# Patient Record
Sex: Female | Born: 1965 | Hispanic: No | Marital: Single | Smoking: Current every day smoker
Health system: Southern US, Community
[De-identification: ages and names within clinical notes are randomized; demographics above are authoritative.]

## PROBLEM LIST (undated history)

## (undated) DIAGNOSIS — K5792 Diverticulitis of intestine, part unspecified, without perforation or abscess without bleeding: Secondary | ICD-10-CM

## (undated) DIAGNOSIS — R519 Headache, unspecified: Secondary | ICD-10-CM

## (undated) DIAGNOSIS — R51 Headache: Secondary | ICD-10-CM

## (undated) DIAGNOSIS — T4145XA Adverse effect of unspecified anesthetic, initial encounter: Secondary | ICD-10-CM

## (undated) DIAGNOSIS — K219 Gastro-esophageal reflux disease without esophagitis: Secondary | ICD-10-CM

## (undated) DIAGNOSIS — Z87442 Personal history of urinary calculi: Secondary | ICD-10-CM

## (undated) DIAGNOSIS — I959 Hypotension, unspecified: Secondary | ICD-10-CM

## (undated) DIAGNOSIS — G459 Transient cerebral ischemic attack, unspecified: Secondary | ICD-10-CM

## (undated) DIAGNOSIS — T8859XA Other complications of anesthesia, initial encounter: Secondary | ICD-10-CM

## (undated) DIAGNOSIS — R569 Unspecified convulsions: Secondary | ICD-10-CM

## (undated) DIAGNOSIS — E236 Other disorders of pituitary gland: Secondary | ICD-10-CM

## (undated) HISTORY — PX: BLADDER SUSPENSION: SHX72

## (undated) HISTORY — PX: KIDNEY SURGERY: SHX687

## (undated) HISTORY — DX: Transient cerebral ischemic attack, unspecified: G45.9

## (undated) HISTORY — PX: PARTIAL HYSTERECTOMY: SHX80

## (undated) HISTORY — PX: ESOPHAGOGASTRODUODENOSCOPY: SHX1529

## (undated) HISTORY — DX: Unspecified convulsions: R56.9

## (undated) HISTORY — PX: DIAGNOSTIC LAPAROSCOPY: SUR761

## (undated) HISTORY — PX: COLONOSCOPY: SHX174

---

## 1995-01-04 HISTORY — PX: ABDOMINAL HYSTERECTOMY: SHX81

## 2003-05-13 ENCOUNTER — Other Ambulatory Visit: Payer: Self-pay

## 2004-01-04 DIAGNOSIS — G459 Transient cerebral ischemic attack, unspecified: Secondary | ICD-10-CM

## 2004-01-04 DIAGNOSIS — R569 Unspecified convulsions: Secondary | ICD-10-CM

## 2004-01-04 HISTORY — DX: Transient cerebral ischemic attack, unspecified: G45.9

## 2004-01-04 HISTORY — DX: Unspecified convulsions: R56.9

## 2004-01-20 ENCOUNTER — Ambulatory Visit: Payer: Self-pay | Admitting: Family Medicine

## 2004-11-04 ENCOUNTER — Ambulatory Visit: Payer: Self-pay | Admitting: Family Medicine

## 2005-10-21 ENCOUNTER — Ambulatory Visit: Payer: Self-pay | Admitting: Urology

## 2006-05-19 ENCOUNTER — Ambulatory Visit: Payer: Self-pay | Admitting: Gastroenterology

## 2006-05-23 ENCOUNTER — Ambulatory Visit: Payer: Self-pay | Admitting: Gastroenterology

## 2006-10-30 ENCOUNTER — Emergency Department: Payer: Self-pay

## 2006-10-30 ENCOUNTER — Other Ambulatory Visit: Payer: Self-pay

## 2007-06-06 ENCOUNTER — Ambulatory Visit: Payer: Self-pay | Admitting: Unknown Physician Specialty

## 2007-11-20 ENCOUNTER — Ambulatory Visit: Payer: Self-pay | Admitting: Unknown Physician Specialty

## 2009-08-27 ENCOUNTER — Emergency Department: Payer: Self-pay | Admitting: Emergency Medicine

## 2012-04-13 ENCOUNTER — Ambulatory Visit: Payer: Self-pay | Admitting: Physician Assistant

## 2013-07-24 ENCOUNTER — Ambulatory Visit: Payer: Self-pay | Admitting: Unknown Physician Specialty

## 2013-07-30 ENCOUNTER — Encounter: Payer: Self-pay | Admitting: *Deleted

## 2013-08-20 ENCOUNTER — Ambulatory Visit (INDEPENDENT_AMBULATORY_CARE_PROVIDER_SITE_OTHER): Payer: BC Managed Care – PPO | Admitting: General Surgery

## 2013-08-20 ENCOUNTER — Encounter: Payer: Self-pay | Admitting: General Surgery

## 2013-08-20 VITALS — BP 110/70 | HR 80 | Resp 12 | Ht 65.0 in | Wt 138.0 lb

## 2013-08-20 DIAGNOSIS — R2231 Localized swelling, mass and lump, right upper limb: Secondary | ICD-10-CM

## 2013-08-20 DIAGNOSIS — R229 Localized swelling, mass and lump, unspecified: Secondary | ICD-10-CM

## 2013-08-20 NOTE — Patient Instructions (Addendum)
Leave dressing in place for at least 2 days if possible leave steri strips in place Call with results

## 2013-08-20 NOTE — Progress Notes (Signed)
Patient ID: Linda Walker, female   DOB: 12/06/1965, 48 y.o.   MRN: [id number]  Chief Complaint  Patient presents with  . Breast Problem    right axillary mass    HPI Linda ElliotStephanie R Walker is a 48 y.o. female.  who presents for a breast evaluation. The most recent mammogram was done on 07-24-13. Previous mammogram was over 10 years ago.  Patient does perform regular self breast checks and does not get regular mammograms done.  States there is a knot at her right axilla. She noticed it 2 years ago. Over the last couple of months it has gotten larger and is painful and it does radiate to the arm and breast.  Denies family history of breast cancer.   HPI  Past Medical History  Diagnosis Date  . TIA (transient ischemic attack) 2006  . Seizure 2006    Past Surgical History  Procedure Laterality Date  . Bladder suspension  2005 ?  Marland Kitchen. Abdominal hysterectomy  1997    Family History  Problem Relation Age of Onset  . Hypertension Mother   . Cancer Father   . Diabetes Father   . Hypertension Father     Social History History  Substance Use Topics  . Smoking status: Current Every Day Smoker -- 0.25 packs/day    Types: Cigarettes  . Smokeless tobacco: Never Used  . Alcohol Use: No    No Known Allergies  No current outpatient prescriptions on file.   No current facility-administered medications for this visit.    Review of Systems Review of Systems  Constitutional: Negative.   Respiratory: Negative.   Cardiovascular: Negative.     Blood pressure 110/70, pulse 80, resp. rate 12, height 5\' 5"  (1.651 m), weight 138 lb (62.596 kg).  Physical Exam Physical Exam  Constitutional: She is oriented to person, place, and time. She appears well-developed and well-nourished.  Neck: Neck supple.  Cardiovascular: Normal rate, regular rhythm and normal heart sounds.   Pulmonary/Chest: Effort normal and breath sounds normal.  Lymphadenopathy:    She has no cervical adenopathy.   Neurological: She is alert and oriented to person, place, and time.  Skin: Skin is warm and dry.  1.5 cm soft tissue mass at the apex of the right axilla.    Data Reviewed Screening mammogram was obtained Cytotec she went suggested a density in the left breast. BI-RAD-0.  Focal spot compression views and ultrasound dated July 24, 2013 showed no persistent abnormality. BI-RAD-1.  Assessment    Noninflamed dermal cyst of the right axilla.     Plan    With the patient reported increasing size and vocal tenderness she desired to proceed with excision. 10 cc of 0.5% Xylocaine with 0.25% Marcaine with one 200,000 units of epinephrine was instilled after alcohol prep. This was supplemented with 2 cc 1% plain Xylocaine. The area was excised through a transverse incision with complete removal of the cyst lining. It was sent for routine histology. The skin defect was closed with a 4-0 Vicryl subcuticular suture. Benzoin and Steri-Strips followed by Telfa Tegaderm dressing was applied.  A prescription for Norco 5/325, #20 with the inscription 1 p.o. Q.4 h. P.r.n. For pain was provided. No refills.  The patient will return for nursing exam in one week.      [facility]: Dr Margretta DittyVanDalen  Nickolas Chalfin, Merrily PewJeffrey W 08/21/2013, 6:24 AM

## 2013-08-21 DIAGNOSIS — R2230 Localized swelling, mass and lump, unspecified upper limb: Secondary | ICD-10-CM | POA: Insufficient documentation

## 2013-08-22 LAB — PATHOLOGY

## 2013-08-27 ENCOUNTER — Ambulatory Visit (INDEPENDENT_AMBULATORY_CARE_PROVIDER_SITE_OTHER): Payer: Self-pay | Admitting: *Deleted

## 2013-08-27 DIAGNOSIS — R229 Localized swelling, mass and lump, unspecified: Secondary | ICD-10-CM

## 2013-08-27 DIAGNOSIS — R2231 Localized swelling, mass and lump, right upper limb: Secondary | ICD-10-CM

## 2013-08-27 NOTE — Progress Notes (Addendum)
Patient came in today for a wound check.  The wound is clean, with no signs of infection noted. Follow up as needed. Aware of pathology, pt pleased.

## 2013-08-27 NOTE — Patient Instructions (Addendum)
Follow up as needed

## 2013-11-04 ENCOUNTER — Encounter: Payer: Self-pay | Admitting: General Surgery

## 2014-05-20 ENCOUNTER — Emergency Department
Admission: EM | Admit: 2014-05-20 | Discharge: 2014-05-21 | Discharge status: Against Medical Advice | Payer: BC Managed Care – PPO | Attending: Obstetrics and Gynecology | Admitting: Obstetrics and Gynecology

## 2014-05-20 ENCOUNTER — Encounter: Payer: Self-pay | Admitting: Emergency Medicine

## 2014-05-20 DIAGNOSIS — R110 Nausea: Secondary | ICD-10-CM | POA: Insufficient documentation

## 2014-05-20 DIAGNOSIS — Z720 Tobacco use: Secondary | ICD-10-CM | POA: Insufficient documentation

## 2014-05-20 DIAGNOSIS — Z90710 Acquired absence of both cervix and uterus: Secondary | ICD-10-CM | POA: Diagnosis not present

## 2014-05-20 DIAGNOSIS — R1011 Right upper quadrant pain: Secondary | ICD-10-CM | POA: Insufficient documentation

## 2014-05-20 DIAGNOSIS — R630 Anorexia: Secondary | ICD-10-CM | POA: Diagnosis not present

## 2014-05-20 LAB — URINALYSIS COMPLETE WITH MICROSCOPIC (ARMC ONLY)
BACTERIA UA: NONE SEEN
BILIRUBIN URINE: NEGATIVE
GLUCOSE, UA: NEGATIVE mg/dL
HGB URINE DIPSTICK: NEGATIVE
KETONES UR: NEGATIVE mg/dL
Nitrite: NEGATIVE
PH: 7 (ref 5.0–8.0)
Protein, ur: NEGATIVE mg/dL
RBC / HPF: NONE SEEN RBC/hpf (ref 0–5)
Specific Gravity, Urine: 1.006 (ref 1.005–1.030)

## 2014-05-20 LAB — CBC WITH DIFFERENTIAL/PLATELET
BASOS ABS: 0 10*3/uL (ref 0–0.1)
BASOS PCT: 1 %
Eosinophils Absolute: 0.1 10*3/uL (ref 0–0.7)
Eosinophils Relative: 1 %
HCT: 41.3 % (ref 35.0–47.0)
Hemoglobin: 13.6 g/dL (ref 12.0–16.0)
Lymphocytes Relative: 46 %
Lymphs Abs: 3.8 10*3/uL — ABNORMAL HIGH (ref 1.0–3.6)
MCH: 27.7 pg (ref 26.0–34.0)
MCHC: 32.8 g/dL (ref 32.0–36.0)
MCV: 84.3 fL (ref 80.0–100.0)
Monocytes Absolute: 0.5 10*3/uL (ref 0.2–0.9)
Monocytes Relative: 6 %
NEUTROS ABS: 3.8 10*3/uL (ref 1.4–6.5)
Neutrophils Relative %: 46 %
PLATELETS: 188 10*3/uL (ref 150–440)
RBC: 4.9 MIL/uL (ref 3.80–5.20)
RDW: 13.5 % (ref 11.5–14.5)
WBC: 8.2 10*3/uL (ref 3.6–11.0)

## 2014-05-20 LAB — COMPREHENSIVE METABOLIC PANEL
ALBUMIN: 4 g/dL (ref 3.5–5.0)
ALT: 15 U/L (ref 14–54)
AST: 22 U/L (ref 15–41)
Alkaline Phosphatase: 55 U/L (ref 38–126)
Anion gap: 7 (ref 5–15)
BUN: 8 mg/dL (ref 6–20)
CALCIUM: 8.5 mg/dL — AB (ref 8.9–10.3)
CO2: 27 mmol/L (ref 22–32)
CREATININE: 0.65 mg/dL (ref 0.44–1.00)
Chloride: 108 mmol/L (ref 101–111)
GFR calc Af Amer: >60 mL/min (ref >60)
GFR calc non Af Amer: >60 mL/min (ref >60)
Glucose, Bld: 92 mg/dL (ref 65–99)
Potassium: 3.8 mmol/L (ref 3.5–5.1)
Sodium: 142 mmol/L (ref 135–145)
Total Bilirubin: 0.3 mg/dL (ref 0.3–1.2)
Total Protein: 6.3 g/dL — ABNORMAL LOW (ref 6.5–8.1)

## 2014-05-20 LAB — LIPASE, BLOOD: Lipase: 40 U/L (ref 22–51)

## 2014-05-20 NOTE — ED Notes (Addendum)
Pt presents to ED with right upper quad abd pain that intermittently radiates down to her groin for over a week that has progressively worsened. States it feels like "back labor on the right side". Pt reports nausea and decrease in appetite; denies vomiting. Hx of surgery on right kidney for "reflux" and states she knows when "something isn't right" with that kidney. Recently finished 10 days of Cipro for kidney infection.

## 2014-05-21 ENCOUNTER — Telehealth: Payer: Self-pay | Admitting: Emergency Medicine

## 2014-05-21 ENCOUNTER — Encounter: Payer: Self-pay | Admitting: Emergency Medicine

## 2014-05-21 ENCOUNTER — Emergency Department
Admission: EM | Admit: 2014-05-21 | Discharge: 2014-05-21 | Disposition: A | Discharge status: Home / Self Care | Payer: BC Managed Care – PPO | Attending: Emergency Medicine | Admitting: Emergency Medicine

## 2014-05-21 ENCOUNTER — Emergency Department: Payer: BC Managed Care – PPO

## 2014-05-21 DIAGNOSIS — Z720 Tobacco use: Secondary | ICD-10-CM | POA: Insufficient documentation

## 2014-05-21 DIAGNOSIS — R1011 Right upper quadrant pain: Secondary | ICD-10-CM | POA: Insufficient documentation

## 2014-05-21 DIAGNOSIS — R109 Unspecified abdominal pain: Secondary | ICD-10-CM

## 2014-05-21 MED ORDER — HYDROCODONE-ACETAMINOPHEN 5-325 MG PO TABS: 1.0000 | ORAL_TABLET | ORAL | Status: DC | PRN

## 2014-05-21 NOTE — ED Provider Notes (Signed)
[facility] Emergency Department Provider Note  ____________________________________________  Time seen: Approximately 4:33 PM  I have reviewed the triage vital signs and the nursing notes.   HISTORY  Chief Complaint Flank Pain    HPI Linda Walker is a 49 y.o. female with a history of kidney infections and surgery about 7 years ago on her right kidney for reflux presents with about 2 weeks of pain in the right flank and the right abdomen.She completed a 10 day course of Cipro yesterday and the pain had improved during the course, but the flank pain returned yesterday.  She had blood work and urine taken last night but she left without being seen due to the wait.  She returns today for evaluation  She describes the pain as waxing and waning but severe.  She has had nausea but no vomiting.  She denies dysuria.  She has had no fevers or chills.  No chest pain or shortness of breath.   Past Medical History  Diagnosis Date  . TIA (transient ischemic attack) 2006  . Seizure 2006    Patient Active Problem List   Diagnosis Date Noted  . Axillary mass 08/21/2013    Past Surgical History  Procedure Laterality Date  . Bladder suspension  2005 ?  Marland Kitchen Abdominal hysterectomy  1997  . Kidney surgery    . Partial hysterectomy      Current Outpatient Rx  Name  Route  Sig  Dispense  Refill  . HYDROcodone-acetaminophen (NORCO/VICODIN) 5-325 MG per tablet   Oral   Take 1-2 tablets by mouth every 4 (four) hours as needed for moderate pain.   15 tablet   0     Allergies Review of patient's allergies indicates no known allergies.  Family History  Problem Relation Age of Onset  . Hypertension Mother   . Cancer Father   . Diabetes Father   . Hypertension Father     Social History History  Substance Use Topics  . Smoking status: Current Every Day Smoker -- 0.25 packs/day    Types: Cigarettes  . Smokeless tobacco: Never Used  . Alcohol Use: No     Review of Systems Constitutional: No fever/chills Eyes: No visual changes. ENT: No sore throat. Cardiovascular: Denies chest pain. Respiratory: Denies shortness of breath. Gastrointestinal: Right-sided intermittent abdominal pain.  nausea, no vomiting.  No diarrhea.  No constipation. Genitourinary: Negative for dysuria. Musculoskeletal: Right flank pain Skin: Negative for rash. Neurological: Negative for headaches, focal weakness or numbness.  10-point ROS otherwise negative.  ____________________________________________   PHYSICAL EXAM:  VITAL SIGNS: ED Triage Vitals  Enc Vitals Group     BP 05/21/14 1409 109/65 mmHg     Pulse Rate 05/21/14 1409 66     Resp 05/21/14 1409 18     Temp 05/21/14 1409 98.1 F (36.7 C)     Temp Source 05/21/14 1409 Oral     SpO2 05/21/14 1409 100 %     Weight 05/21/14 1409 138 lb (62.596 kg)     Height 05/21/14 1409 5' 4"  (1.626 m)     Head Cir --      Peak Flow --      Pain Score 05/21/14 1411 8     Pain Loc --      Pain Edu? --      Excl. in [place]? --     Constitutional: Alert and oriented. Well appearing and in no acute distress. Eyes: Conjunctivae are normal. PERRL. EOMI. Head: Atraumatic.  Nose: No congestion/rhinnorhea. Mouth/Throat: Mucous membranes are moist.  Oropharynx non-erythematous. Neck: No stridor.   Cardiovascular: Normal rate, regular rhythm. Grossly normal heart sounds.  Good peripheral circulation. Respiratory: Normal respiratory effort.  No retractions. Lungs CTAB. Gastrointestinal: Soft soft with mild to moderate right upper quadrant tenderness. No distention. No abdominal bruits.  Mild to moderate right CVA tenderness. Musculoskeletal: No lower extremity tenderness nor edema.  No joint effusions. Neurologic:  Normal speech and language. No gross focal neurologic deficits are appreciated. Speech is normal. No gait instability. Skin:  Skin is warm, dry and intact. No rash noted. Psychiatric: Mood and affect are  normal. Speech and behavior are normal.  ____________________________________________   LABS (all labs ordered are listed, but only abnormal results are displayed)  Recent Results (from the past 2160 hour(s))  Comprehensive metabolic panel     Status: Abnormal   Collection Time: 05/20/14  8:42 PM  Result Value Ref Range   Sodium 142 135 - 145 mmol/L   Potassium 3.8 3.5 - 5.1 mmol/L   Chloride 108 101 - 111 mmol/L   CO2 27 22 - 32 mmol/L   Glucose, Bld 92 65 - 99 mg/dL   BUN 8 6 - 20 mg/dL   Creatinine, Ser 0.65 0.44 - 1.00 mg/dL   Calcium 8.5 (L) 8.9 - 10.3 mg/dL   Total Protein 6.3 (L) 6.5 - 8.1 g/dL   Albumin 4.0 3.5 - 5.0 g/dL   AST 22 15 - 41 U/L   ALT 15 14 - 54 U/L   Alkaline Phosphatase 55 38 - 126 U/L   Total Bilirubin 0.3 0.3 - 1.2 mg/dL   GFR calc non Af Amer >60 >60 mL/min   GFR calc Af Amer >60 >60 mL/min    Comment: (NOTE) The eGFR has been calculated using the CKD EPI equation. This calculation has not been validated in all clinical situations. eGFR's persistently <60 mL/min signify possible Chronic Kidney Disease.    Anion gap 7 5 - 15  Lipase, blood     Status: None   Collection Time: 05/20/14  8:42 PM  Result Value Ref Range   Lipase 40 22 - 51 U/L  CBC WITH DIFFERENTIAL     Status: Abnormal   Collection Time: 05/20/14  8:42 PM  Result Value Ref Range   WBC 8.2 3.6 - 11.0 K/uL   RBC 4.90 3.80 - 5.20 MIL/uL   Hemoglobin 13.6 12.0 - 16.0 g/dL   HCT 41.3 35.0 - 47.0 %   MCV 84.3 80.0 - 100.0 fL   MCH 27.7 26.0 - 34.0 pg   MCHC 32.8 32.0 - 36.0 g/dL   RDW 13.5 11.5 - 14.5 %   Platelets 188 150 - 440 K/uL   Neutrophils Relative % 46 %   Neutro Abs 3.8 1.4 - 6.5 K/uL   Lymphocytes Relative 46 %   Lymphs Abs 3.8 (H) 1.0 - 3.6 K/uL   Monocytes Relative 6 %   Monocytes Absolute 0.5 0.2 - 0.9 K/uL   Eosinophils Relative 1 %   Eosinophils Absolute 0.1 0 - 0.7 K/uL   Basophils Relative 1 %   Basophils Absolute 0.0 0 - 0.1 K/uL  Urinalysis complete,  with microscopic [facility])     Status: Abnormal   Collection Time: 05/20/14  8:42 PM  Result Value Ref Range   Color, Urine STRAW (A) YELLOW   APPearance CLEAR (A) CLEAR   Glucose, UA NEGATIVE NEGATIVE mg/dL   Bilirubin Urine NEGATIVE NEGATIVE   Ketones, ur  NEGATIVE NEGATIVE mg/dL   Specific Gravity, Urine 1.006 1.005 - 1.030   Hgb urine dipstick NEGATIVE NEGATIVE   pH 7.0 5.0 - 8.0   Protein, ur NEGATIVE NEGATIVE mg/dL   Nitrite NEGATIVE NEGATIVE   Leukocytes, UA TRACE (A) NEGATIVE   RBC / HPF NONE SEEN 0 - 5 RBC/hpf   WBC, UA 0-5 0 - 5 WBC/hpf   Bacteria, UA NONE SEEN NONE SEEN   Squamous Epithelial / LPF 0-5 (A) NONE SEEN     ____________________________________________  EKG  Not indicated ____________________________________________  RADIOLOGY  US Renal  05/21/2014   CLINICAL DATA:  Right flank pain  EXAM: RENAL / URINARY TRACT ULTRASOUND COMPLETE  COMPARISON:  04/13/2012  FINDINGS: Right Kidney:  Length: 11.4 cm. Echogenicity within normal limits. No mass or hydronephrosis visualized.  Left Kidney:  Length: 11.9 cm. Echogenicity within normal limits. No mass or hydronephrosis visualized.  Bladder:  Appears normal for degree of bladder distention.  IMPRESSION: No acute abnormality noted.   Electronically Signed   By: Inez Catalina M.D.   On: 05/21/2014 17:51   US Abdomen Limited Ruq  05/21/2014   CLINICAL DATA:  Right upper quadrant pain for 2 weeks.  EXAM: US ABDOMEN LIMITED - RIGHT UPPER QUADRANT  COMPARISON:  08/27/2009 abdominal pelvic CT.  FINDINGS: Gallbladder:  No gallstones or wall thickening visualized. No sonographic Murphy sign noted.  Common bile duct:  Diameter: Normal, 3 mm.  Liver:  No focal lesion identified. Within normal limits in parenchymal echogenicity.  IMPRESSION: Normal right upper quadrant ultrasound ; no explanation for pain.   Electronically Signed   By: Abigail Miyamoto M.D.   On: 05/21/2014 17:50     ____________________________________________   PROCEDURES  Procedure(s) performed: None  Critical Care performed: No  ____________________________________________   INITIAL IMPRESSION / ASSESSMENT AND PLAN / ED COURSE  Pertinent labs & imaging results that were available during my care of the patient were reviewed by me and considered in my medical decision making (see chart for details).  The patient has normal labs including a clean urinalysis and normal ultrasound of her kidneys and her right upper quadrant.  She has normal vital signs, is afebrile, and in no acute distress.  I do not believe she is at risk for a vascular issue (ischemic kidney).  I discussed my reassuring findings with the patient and advised her to follow up with Dr. Jacqlyn Larsen at the next available opportunity.  She understands and agrees with the plan.  I gave her my usual and customary return precautions. ____________________________________________   FINAL CLINICAL IMPRESSION(S) / ED DIAGNOSES  Final diagnoses:  RUQ abdominal pain  Right flank pain     Hinda Kehr, MD 05/21/14 1818

## 2014-05-21 NOTE — ED Notes (Signed)
Patient called for the third time. No response. Patient eloped from the status board at this time. ED registration and charge nurse made aware so that in the event that patient presents back to the desk requesting to be seen. 

## 2014-05-21 NOTE — ED Notes (Signed)
Patient called the second time for triage. No response. RN will reattempt to call patient one final time prior to eloping from the status board.   

## 2014-05-21 NOTE — ED Notes (Signed)
Patient called for triage by this RN. No answer. Will reattempt. 

## 2014-05-21 NOTE — Discharge Instructions (Signed)
You have been seen in the Emergency Department (ED) for flank and abdominal pain.  Your evaluation did not identify a clear cause of your symptoms but was generally reassuring.  Your vital signs, urinalysis, blood work, and ultrasounds of your kidneys, gallbladder, and liver were all normal.  Please follow up as instructed above regarding todays emergent visit and the symptoms that are bothering you.  Return to the ED if your abdominal pain worsens or fails to improve, you develop bloody vomiting, bloody diarrhea, you are unable to tolerate fluids due to vomiting, fever greater than 101, or other symptoms that concern you.  Take Vicodin as prescribed. Do not drink alcohol, drive or participate in any other potentially dangerous activities while taking this medication as it may make you sleepy. Do not take this medication with any other sedating medications, either prescription or over-the-counter. If you were prescribed Percocet or Vicodin, do not take these with acetaminophen (Tylenol) as it is already contained within these medications.   This medication is an opiate (or narcotic) pain medication and can be habit forming.  Use it as little as possible to achieve adequate pain control.  Do not use or use it with extreme caution if you have a history of opiate abuse or dependence.  If you are on a pain contract with your primary care doctor or a pain specialist, be sure to let them know you were prescribed this medication today from the [facility].  This medication is intended for your use only - do not give any to anyone else and keep it in a secure place where nobody else, especially children, have access to it.  It will also cause or worsen constipation, so you may want to consider taking an over-the-counter stool softener while you are taking this medication.

## 2014-05-21 NOTE — ED Notes (Signed)
Pt was here last night to be seen for abd pain. Pt had protocols completed but never saw a provider. Said she got tired of waiting and left. Returns today for abd pain.

## 2014-05-21 NOTE — ED Notes (Signed)
Pt reports that she has had a kidney infection she just finished Cipro Monday, yesterday she developed right flank pain. She was here last night and left. She had blood and urine taken and does not want it taken again.

## 2014-10-28 ENCOUNTER — Emergency Department
Admission: EM | Admit: 2014-10-28 | Discharge: 2014-10-28 | Disposition: A | Discharge status: Home / Self Care | Payer: BC Managed Care – PPO | Attending: Emergency Medicine | Admitting: Emergency Medicine

## 2014-10-28 ENCOUNTER — Emergency Department: Payer: BC Managed Care – PPO

## 2014-10-28 ENCOUNTER — Encounter: Payer: Self-pay | Admitting: Emergency Medicine

## 2014-10-28 DIAGNOSIS — J159 Unspecified bacterial pneumonia: Secondary | ICD-10-CM | POA: Diagnosis not present

## 2014-10-28 DIAGNOSIS — Z720 Tobacco use: Secondary | ICD-10-CM | POA: Insufficient documentation

## 2014-10-28 DIAGNOSIS — J189 Pneumonia, unspecified organism: Secondary | ICD-10-CM

## 2014-10-28 DIAGNOSIS — Z3202 Encounter for pregnancy test, result negative: Secondary | ICD-10-CM | POA: Diagnosis not present

## 2014-10-28 DIAGNOSIS — R1011 Right upper quadrant pain: Secondary | ICD-10-CM | POA: Insufficient documentation

## 2014-10-28 DIAGNOSIS — R109 Unspecified abdominal pain: Secondary | ICD-10-CM

## 2014-10-28 LAB — URINALYSIS COMPLETE WITH MICROSCOPIC (ARMC ONLY)
Bacteria, UA: NONE SEEN
Bilirubin Urine: NEGATIVE
GLUCOSE, UA: NEGATIVE mg/dL
Ketones, ur: NEGATIVE mg/dL
LEUKOCYTES UA: NEGATIVE
NITRITE: NEGATIVE
Protein, ur: 30 mg/dL — AB
Specific Gravity, Urine: 1.021 (ref 1.005–1.030)
pH: 6 (ref 5.0–8.0)

## 2014-10-28 LAB — CBC
HCT: 43.9 % (ref 35.0–47.0)
Hemoglobin: 14.8 g/dL (ref 12.0–16.0)
MCH: 28.3 pg (ref 26.0–34.0)
MCHC: 33.6 g/dL (ref 32.0–36.0)
MCV: 84 fL (ref 80.0–100.0)
Platelets: 206 10*3/uL (ref 150–440)
RBC: 5.23 MIL/uL — ABNORMAL HIGH (ref 3.80–5.20)
RDW: 12.9 % (ref 11.5–14.5)
WBC: 20.7 10*3/uL — AB (ref 3.6–11.0)

## 2014-10-28 LAB — COMPREHENSIVE METABOLIC PANEL
ALT: 16 U/L (ref 14–54)
AST: 23 U/L (ref 15–41)
Albumin: 4.2 g/dL (ref 3.5–5.0)
Alkaline Phosphatase: 68 U/L (ref 38–126)
Anion gap: 11 (ref 5–15)
BILIRUBIN TOTAL: 0.9 mg/dL (ref 0.3–1.2)
BUN: 9 mg/dL (ref 6–20)
CO2: 22 mmol/L (ref 22–32)
CREATININE: 0.75 mg/dL (ref 0.44–1.00)
Calcium: 8.7 mg/dL — ABNORMAL LOW (ref 8.9–10.3)
Chloride: 103 mmol/L (ref 101–111)
GFR calc Af Amer: >60 mL/min (ref >60)
Glucose, Bld: 126 mg/dL — ABNORMAL HIGH (ref 65–99)
Potassium: 3.9 mmol/L (ref 3.5–5.1)
Sodium: 136 mmol/L (ref 135–145)
TOTAL PROTEIN: 7.3 g/dL (ref 6.5–8.1)

## 2014-10-28 LAB — LIPASE, BLOOD: Lipase: 29 U/L (ref 11–51)

## 2014-10-28 LAB — POCT PREGNANCY, URINE: Preg Test, Ur: NEGATIVE

## 2014-10-28 MED ORDER — TRAMADOL HCL 50 MG PO TABS: 50.0000 mg | ORAL_TABLET | Freq: Four times a day (QID) | ORAL | Status: DC | PRN

## 2014-10-28 MED ORDER — KETOROLAC TROMETHAMINE 30 MG/ML IJ SOLN
30.0000 mg | Freq: Once | INTRAMUSCULAR | Status: AC
  Administered 2014-10-28: 30 mg via INTRAVENOUS
  Filled 2014-10-28: qty 1

## 2014-10-28 MED ORDER — DEXTROSE 5 % IV SOLN
1.0000 g | Freq: Once | INTRAVENOUS | Status: AC
  Administered 2014-10-28: 1 g via INTRAVENOUS
  Filled 2014-10-28: qty 10

## 2014-10-28 MED ORDER — LEVOFLOXACIN 500 MG PO TABS: 500.0000 mg | ORAL_TABLET | Freq: Every day | ORAL | Status: DC

## 2014-10-28 NOTE — ED Provider Notes (Signed)
Time Seen: Approximately *1900 I have reviewed the triage notes  Chief Complaint: Flank Pain   History of Present Illness: Linda Walker is a 49 y.o. female who states right-sided flank pain. She noticed the pain last night approximately 10 PM. She states it hurts to take a deep breath but denies any chest pain or productive cough or wheezing. She states the pain is relatively constant though is worse with deep inspiration. Pain is mainly located in the right flank area with some mild tenderness toward the right upper quadrant. Patient denies any persistent nausea, vomiting, loose stool or diarrhea. She states she had previous surgery on her right kidney approximately 10 years ago due to what sounds like some urine reflux. She's also had a previous bladder suspension surgery. Past Medical History  Diagnosis Date  . TIA (transient ischemic attack) 2006  . Seizure [facility]) 2006    Patient Active Problem List   Diagnosis Date Noted  . Axillary mass 08/21/2013    Past Surgical History  Procedure Laterality Date  . Bladder suspension  2005 ?  Marland Kitchen Abdominal hysterectomy  1997  . Kidney surgery    . Partial hysterectomy      Past Surgical History  Procedure Laterality Date  . Bladder suspension  2005 ?  Marland Kitchen Abdominal hysterectomy  1997  . Kidney surgery    . Partial hysterectomy      Current Outpatient Rx  Name  Route  Sig  Dispense  Refill  . HYDROcodone-acetaminophen (NORCO/VICODIN) 5-325 MG per tablet   Oral   Take 1-2 tablets by mouth every 4 (four) hours as needed for moderate pain.   15 tablet   0   . levofloxacin (LEVAQUIN) 500 MG tablet   Oral   Take 1 tablet (500 mg total) by mouth daily.   7 tablet   0   . traMADol (ULTRAM) 50 MG tablet   Oral   Take 1 tablet (50 mg total) by mouth every 6 (six) hours as needed.   20 tablet   0     Allergies:  Review of patient's allergies indicates no known allergies.  Family History: Family History  Problem Relation  Age of Onset  . Hypertension Mother   . Cancer Father   . Diabetes Father   . Hypertension Father     Social History: Social History  Substance Use Topics  . Smoking status: Current Every Day Smoker -- 0.25 packs/day    Types: Cigarettes  . Smokeless tobacco: Never Used  . Alcohol Use: No     Review of Systems:   10 point review of systems was performed and was otherwise negative:  Constitutional: No fever Eyes: No visual disturbances ENT: No sore throat, ear pain Cardiac: No chest pain Respiratory: No shortness of breath, wheezing, or stridor Abdomen: No abdominal pain, no vomiting, No diarrhea Endocrine: No weight loss, No night sweats Extremities: No peripheral edema, cyanosis Skin: No rashes, easy bruising Neurologic: No focal weakness, trouble with speech or swollowing Urologic: No dysuria, Hematuria, or urinary frequency   Physical Exam:  ED Triage Vitals  Enc Vitals Group     BP 10/28/14 1807 88/53 mmHg     Pulse Rate 10/28/14 1807 112     Resp 10/28/14 1807 16     Temp 10/28/14 1807 98.5 F (36.9 C)     Temp src --      SpO2 10/28/14 1807 92 %     Weight 10/28/14 1807 125 lb (56.7 kg)  Height 10/28/14 1807  (1.626 m)     Head Cir --      Peak Flow --      Pain Score 10/28/14 1820 9     Pain Loc --      Pain Edu? --      Excl. in [place]? --     General: Awake , Alert , and Oriented times 3; GCS 15 Head: Normal cephalic , atraumatic Eyes: Pupils equal , round, reactive to light Nose/Throat: No nasal drainage, patent upper airway without erythema or exudate.  Neck: Supple, Full range of motion, No anterior adenopathy or palpable thyroid masses Lungs: Clear to ascultation without wheezes , rhonchi, or rales Heart: Regular rate, regular rhythm without murmurs , gallops , or rubs Abdomen: Patient has some mild tenderness with palpation toward her right flank area without any anterior Murphy's sign. No rebound guarding or rigidity. Bowel sounds are  positive and symmetric in all 4 quadrants. No reproducible tenderness over McBurney's point.        Extremities: 2 plus symmetric pulses. No edema, clubbing or cyanosis Neurologic: normal ambulation, Motor symmetric without deficits, sensory intact Skin: warm, dry, no rashes   Labs:   All laboratory work was reviewed including any pertinent negatives or positives listed below:  Labs Reviewed  COMPREHENSIVE METABOLIC PANEL - Abnormal; Notable for the following:    Glucose, Bld 126 (*)    Calcium 8.7 (*)    All other components within normal limits  CBC - Abnormal; Notable for the following:    WBC 20.7 (*)    RBC 5.23 (*)    All other components within normal limits  URINALYSIS COMPLETEWITH MICROSCOPIC ([facility] ONLY) - Abnormal; Notable for the following:    Color, Urine AMBER (*)    APPearance HAZY (*)    Hgb urine dipstick 1+ (*)    Protein, ur 30 (*)    Squamous Epithelial / LPF 6-30 (*)    All other components within normal limits  LIPASE, BLOOD  POC URINE PREG, ED  POCT PREGNANCY, URINE   reviewed the laboratory work showed a significantly elevated white blood cell count    Radiology:   CLINICAL DATA: Right flank pain. Sharp stabbing pain when urinating.  EXAM: CT ABDOMEN AND PELVIS WITHOUT CONTRAST  TECHNIQUE: Multidetector CT imaging of the abdomen and pelvis was performed following the standard protocol without IV contrast.  COMPARISON: 08/27/2009  FINDINGS: Lower chest: Focal rounded consolidation in the medial posterior right lower lobe. This area has air bronchograms and findings are suggestive for pneumonia. No evidence for pleural effusions. Linear densities at the left lung base are suggestive for atelectasis.  Hepatobiliary: Normal appearance of liver and gallbladder.  Pancreas: Normal appearance of the pancreas without inflammation.  Spleen: Normal appearance of spleen without enlargement.  Adrenals/Urinary Tract: Normal appearance of the  adrenal glands. Negative for right kidney stones or hydronephrosis. Question a punctate left kidney stone in lower pole. Low-density structure in the right kidney could represent a cyst but indeterminate on this noncontrast examination. Normal appearance of the urinary bladder without stones.  Stomach/Bowel: Normal appearance of the stomach and duodenum. No acute abnormality to the small or large bowel. No evidence for bowel dilatation or obstruction. Normal appearance of the appendix.  Vascular/Lymphatic: Atherosclerotic calcifications in the aorta without aneurysm. No significant lymphadenopathy.  Reproductive: Uterus appears to be absent. There appears to be left ovarian tissue without gross abnormality.  Other: Negative for free air. No free fluid.  Musculoskeletal:  No acute abnormality.  IMPRESSION: Focal consolidation in the posterior medial right lower lobe. Findings are most compatible with pneumonia. Recommend a 4 week follow-up two view chest radiograph to ensure resolution.  Question a punctate left kidney stone without hydronephrosis.   Electronically Signed By: Richarda OverlieAdam Henn M.D. On: 10/28/2014 21:25   * I personally reviewed the radiologic studies      ED Course:  Patient's stay here showed some symptomatic improvement after IV Toradol and IV fluid bolus. Patient was found to have on her right renal CAT scan what appears to be right lower lobe pneumonia. This would fit with the patient's elevated white blood cell count and normal urinalysis. The patient's not hypoxic and I felt he be treated on an outpatient basis. She was given a dose of IV Rocephin here in emergency Department be discharged on Levaquin. Patient is otherwise hemodynamically stable and I felt unlikely to be septic      Final Clinical Impression:   Final diagnoses:  Right flank pain  Community acquired pneumonia     Plan:  Outpatient management Patient was advised to return  immediately if condition worsens. Patient was advised to follow up with her primary care physician or other specialized physicians involved and in their current assessment.             Jennye MoccasinBrian S Quigley, MD 10/28/14 [phone]

## 2014-10-28 NOTE — ED Notes (Signed)
Pt C/O right flank pain 9/10 on pain scale. Symptoms reported are sharp, stabbing pain when urinating and a squeezing sensation. Pt reports taking tylenol 1100 today. Hx of right kidney surgery 10 years ago.

## 2014-10-28 NOTE — Discharge Instructions (Signed)

## 2016-04-02 ENCOUNTER — Emergency Department: Payer: BC Managed Care – PPO

## 2016-04-02 ENCOUNTER — Emergency Department
Admission: EM | Admit: 2016-04-02 | Discharge: 2016-04-02 | Disposition: A | Discharge status: Home / Self Care | Payer: BC Managed Care – PPO | Attending: Emergency Medicine | Admitting: Emergency Medicine

## 2016-04-02 ENCOUNTER — Encounter: Payer: Self-pay | Admitting: Emergency Medicine

## 2016-04-02 DIAGNOSIS — Z23 Encounter for immunization: Secondary | ICD-10-CM | POA: Diagnosis not present

## 2016-04-02 DIAGNOSIS — S20212A Contusion of left front wall of thorax, initial encounter: Secondary | ICD-10-CM | POA: Insufficient documentation

## 2016-04-02 DIAGNOSIS — S80212A Abrasion, left knee, initial encounter: Secondary | ICD-10-CM

## 2016-04-02 DIAGNOSIS — S50312A Abrasion of left elbow, initial encounter: Secondary | ICD-10-CM | POA: Insufficient documentation

## 2016-04-02 DIAGNOSIS — Y999 Unspecified external cause status: Secondary | ICD-10-CM | POA: Insufficient documentation

## 2016-04-02 DIAGNOSIS — Y92512 Supermarket, store or market as the place of occurrence of the external cause: Secondary | ICD-10-CM | POA: Insufficient documentation

## 2016-04-02 DIAGNOSIS — S86912A Strain of unspecified muscle(s) and tendon(s) at lower leg level, left leg, initial encounter: Secondary | ICD-10-CM | POA: Insufficient documentation

## 2016-04-02 DIAGNOSIS — Y939 Activity, unspecified: Secondary | ICD-10-CM | POA: Insufficient documentation

## 2016-04-02 DIAGNOSIS — W109XXA Fall (on) (from) unspecified stairs and steps, initial encounter: Secondary | ICD-10-CM | POA: Diagnosis not present

## 2016-04-02 DIAGNOSIS — W19XXXA Unspecified fall, initial encounter: Secondary | ICD-10-CM

## 2016-04-02 DIAGNOSIS — S99912A Unspecified injury of left ankle, initial encounter: Secondary | ICD-10-CM | POA: Diagnosis present

## 2016-04-02 DIAGNOSIS — S93402A Sprain of unspecified ligament of left ankle, initial encounter: Secondary | ICD-10-CM | POA: Insufficient documentation

## 2016-04-02 DIAGNOSIS — F17210 Nicotine dependence, cigarettes, uncomplicated: Secondary | ICD-10-CM | POA: Insufficient documentation

## 2016-04-02 DIAGNOSIS — S50319A Abrasion of unspecified elbow, initial encounter: Secondary | ICD-10-CM

## 2016-04-02 MED ORDER — TRAMADOL HCL 50 MG PO TABS: 50.0000 mg | ORAL_TABLET | Freq: Four times a day (QID) | ORAL | 0 refills | Status: DC | PRN

## 2016-04-02 MED ORDER — MORPHINE SULFATE (PF) 2 MG/ML IV SOLN
2.0000 mg | Freq: Once | INTRAVENOUS | Status: AC
  Administered 2016-04-02: 2 mg via INTRAMUSCULAR
  Filled 2016-04-02: qty 1

## 2016-04-02 MED ORDER — TETANUS-DIPHTH-ACELL PERTUSSIS 5-2.5-18.5 LF-MCG/0.5 IM SUSP
0.5000 mL | Freq: Once | INTRAMUSCULAR | Status: AC
  Administered 2016-04-02: 0.5 mL via INTRAMUSCULAR
  Filled 2016-04-02: qty 0.5

## 2016-04-02 NOTE — ED Triage Notes (Signed)
First nurse note:  AAOx3.  Skin warm and dry.  Respirations regular and non labored.  NAD

## 2016-04-02 NOTE — ED Triage Notes (Signed)
Pt reports falling at a [employer] today. Pt landed on left side onto concrete. Pt reports left knee and ankle pain. No obvious deformities noted. Pt denies LOC.

## 2016-04-02 NOTE — ED Provider Notes (Signed)
[facility] Emergency Department Provider Note  ____________________________________________  Time seen: Approximately 3:49 PM  I have reviewed the triage vital signs and the nursing notes.   HISTORY  Chief Complaint Fall; Knee Pain; and Ankle Pain    HPI Linda Walker is a 51 y.o. female who presents emergency department complaining of left rib, left knee, left ankle pain. Patient reports that she was leaving a store when she missed the step off the curb. She reports that she fell landing on her left side. She is reporting abrasions to the left elbow and left knee. She was ambulatory with pain. She reports the majority of symptoms are in her left knee. Patient did not hit her head or lose consciousness. No headache, visual changes, neck pain, chest pain, shortness of breath. No medications prior to arrival. Patient has a history of chronic kidney disease and is unable to take NSAIDs.   Past Medical History:  Diagnosis Date  . Seizure (HCC) 2006  . TIA (transient ischemic attack) 2006    Patient Active Problem List   Diagnosis Date Noted  . 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 ABDOMINAL HYSTERECTOMY  1997  . BLADDER SUSPENSION  2005 ?  Marland Kitchen KIDNEY SURGERY    . PARTIAL HYSTERECTOMY      Prior to Admission medications   Medication Sig Start Date End Date Taking? Authorizing Provider  traMADol (ULTRAM) 50 MG tablet Take 1 tablet (50 mg total) by mouth every 6 (six) hours as needed. 04/02/16   Delorise Royals Alpa Salvo, PA-C    Allergies Patient has no known allergies.  Family History  Problem Relation Age of Onset  . Hypertension Mother   . Cancer Father   . Diabetes Father   . Hypertension Father     Social History Social History  Substance Use Topics  . Smoking status: Current Every Day Smoker    Packs/day: 0.25    Types: Cigarettes  . Smokeless tobacco: Never Used  . Alcohol use No      Review of Systems  Constitutional: No fever/chills Eyes: No visual changes.  Cardiovascular: no chest pain. Respiratory: no cough. No SOB. Gastrointestinal: No abdominal pain.  No nausea, no vomiting.  Musculoskeletal: Positive for left rib, left knee, left ankle pain. Skin: Negative for rash, abrasions, lacerations, ecchymosis. Neurological: Negative for headaches, focal weakness or numbness. 10-point ROS otherwise negative.  ____________________________________________   PHYSICAL EXAM:  VITAL SIGNS: ED Triage Vitals  Enc Vitals Group     BP 04/02/16 1501 (!) 106/58     Pulse Rate 04/02/16 1501 75     Resp 04/02/16 1501 18     Temp 04/02/16 1501 98 F (36.7 C)     Temp Source 04/02/16 1501 Oral     SpO2 04/02/16 1501 99 %     Weight 04/02/16 1502 140 lb (63.5 kg)     Height 04/02/16 1502  (1.651 m)     Head Circumference --      Peak Flow --      Pain Score 04/02/16 1502 8     Pain Loc --      Pain Edu? --      Excl. in GC? --      Constitutional: Alert and oriented. Well appearing and in no acute distress. Eyes: Conjunctivae are normal. PERRL. EOMI. Head: Atraumatic. Neck: No stridor.  No cervical spine tenderness to palpation.  Cardiovascular: Normal rate, regular rhythm. Normal  S1 and S2.  Good peripheral circulation. Respiratory: Normal respiratory effort without tachypnea or retractions. Lungs CTAB. Good air entry to the bases with no decreased or absent breath sounds. Musculoskeletal: Full range of motion to all extremities. No gross deformities appreciated.No visible deformities or ecchymosis noted to left rib cage. She is tender to palpation diffusely over the left anterolateral ribs. No point tenderness. No palpable abnormality. No crepitus. No subcutaneous emphysema. Good underlying breath sounds bilaterally. Examination of the left knee reveals no deformities. Abrasion is noted to the anterior aspect. No significant edema. No ballottement. Range of  motion to the knee. Patient has diffuse tenderness to palpation over the anterior and posterior aspect. Varus, valgus, Lachman's, McMurray's is negative. Examination of the left ankle reveals mild ecchymosis to the anterolateral aspect. No edema or deformity. Full range of motion to the ankle. Patient is mildly tender palpation over the talonavicular joint line. No palpable abnormality. Dorsalis pedis pulse intact. Sensation intact 5 digits. Neurologic:  Normal speech and language. No gross focal neurologic deficits are appreciated.  Skin:  Skin is warm, dry and intact. No rash noted. Psychiatric: Mood and affect are normal. Speech and behavior are normal. Patient exhibits appropriate insight and judgement.   ____________________________________________   LABS (all labs ordered are listed, but only abnormal results are displayed)  Labs Reviewed - No data to display ____________________________________________  EKG   ____________________________________________  RADIOLOGY Festus Barren Lanesha Azzaro, personally viewed and evaluated these images (plain radiographs) as part of my medical decision making, as well as reviewing the written report by the radiologist.  Dg Ribs Unilateral W/chest Left  Result Date: 04/02/2016 CLINICAL DATA:  Initial evaluation for acute left lower axillary pain status post fall. EXAM: LEFT RIBS AND CHEST - 3+ VIEW COMPARISON:  Prior radiograph from 10/30/2006. FINDINGS: Cardiac mediastinal silhouettes are stable in size and contour, and remain within normal limits. Lungs are normally inflated. No pulmonary edema or pleural effusion. No pneumothorax. Metallic BB marker overlies the lower left chest wall, at site of pain. No acute displaced rib fracture. No other acute osseous abnormality. IMPRESSION: 1. No acute displaced rib fracture. 2. No other active cardiopulmonary disease. Electronically Signed   By: Rise Mu M.D.   On: 04/02/2016 16:35   Dg Ankle  Complete Left  Result Date: 04/02/2016 CLINICAL DATA:  Larey Seat today white shopping, pain and laceration at anterior patella, lateral LEFT ankle pain EXAM: LEFT ANKLE COMPLETE - 3+ VIEW COMPARISON:  None FINDINGS: Osseous mineralization normal. Joint spaces preserved. No acute fracture, dislocation, or bone destruction. Small plantar calcaneal spur. IMPRESSION: No acute osseous abnormalities. Small plantar calcaneal spur. Electronically Signed   By: Ulyses Southward M.D.   On: 04/02/2016 15:45   Dg Knee Complete 4 Views Left  Result Date: 04/02/2016 CLINICAL DATA:  Larey Seat today white shopping, pain and laceration at anterior patella, lateral LEFT ankle pain EXAM: LEFT KNEE - COMPLETE 4+ VIEW COMPARISON:  None FINDINGS: Question mild osseous demineralization. Minimal medial compartment joint space narrowing. No acute fracture, dislocation, or bone destruction. Mild anterior infrapatellar soft tissue swelling. No knee joint effusion. IMPRESSION: No acute osseous abnormalities. Electronically Signed   By: Ulyses Southward M.D.   On: 04/02/2016 15:44    ____________________________________________    PROCEDURES  Procedure(s) performed:    Procedures  The wound is cleansed, debrided of foreign material as much as possible, and dressed. The patient is alerted to watch for any signs of infection (redness, pus, pain, increased swelling or fever) and  call if such occurs. Home wound care instructions are provided. Tetanus vaccination status reviewed: Td vaccination indicated and given today.   Medications  morphine 2 MG/ML injection 2 mg (2 mg Intramuscular Given 04/02/16 1613)  Tdap (BOOSTRIX) injection 0.5 mL (0.5 mLs Intramuscular Given 04/02/16 1613)     ____________________________________________   INITIAL IMPRESSION / ASSESSMENT AND PLAN / ED COURSE  Pertinent labs & imaging results that were available during my care of the patient were reviewed by me and considered in my medical decision making (see  chart for details).  Review of the [place] CSRS was performed in accordance of the [facility] prior to dispensing any controlled drugs.     Patient's diagnosis is consistent with Fall resulting in contusion of the left ribs, left knee, left ankle. Patient also has abrasions to the knee and left elbow. Patient is given tetanus shot in the emergency department.. Patient will be discharged home with prescriptions for limited prescription for Ultram as patient cannot take NSAIDs due to kidney failure.. Patient is to follow up with primary care as needed or otherwise directed. Patient is given ED precautions to return to the ED for any worsening or new symptoms.     ____________________________________________  FINAL CLINICAL IMPRESSION(S) / ED DIAGNOSES  Final diagnoses:  Fall, initial encounter  Sprain of left ankle, unspecified ligament, initial encounter  Strain of left knee, initial encounter  Contusion of rib on left side, initial encounter  Abrasion, elbow w/o infection  Abrasion of left knee, initial encounter      NEW MEDICATIONS STARTED DURING THIS VISIT:  New Prescriptions   TRAMADOL (ULTRAM) 50 MG TABLET    Take 1 tablet (50 mg total) by mouth every 6 (six) hours as needed.        This chart was dictated using voice recognition software/Dragon. Despite best efforts to proofread, errors can occur which can change the meaning. Any change was purely unintentional.    Racheal Patches, PA-C 04/02/16 1652    Merrily Brittle, MD 04/02/16 1840

## 2017-05-03 ENCOUNTER — Encounter: Payer: Self-pay | Admitting: Emergency Medicine

## 2017-05-03 ENCOUNTER — Emergency Department: Payer: BC Managed Care – PPO

## 2017-05-03 ENCOUNTER — Other Ambulatory Visit: Payer: Self-pay

## 2017-05-03 ENCOUNTER — Emergency Department
Admission: EM | Admit: 2017-05-03 | Discharge: 2017-05-03 | Disposition: A | Discharge status: Home / Self Care | Payer: BC Managed Care – PPO | Attending: Emergency Medicine | Admitting: Emergency Medicine

## 2017-05-03 DIAGNOSIS — F17210 Nicotine dependence, cigarettes, uncomplicated: Secondary | ICD-10-CM | POA: Insufficient documentation

## 2017-05-03 DIAGNOSIS — R1031 Right lower quadrant pain: Secondary | ICD-10-CM | POA: Diagnosis present

## 2017-05-03 DIAGNOSIS — K5792 Diverticulitis of intestine, part unspecified, without perforation or abscess without bleeding: Secondary | ICD-10-CM | POA: Insufficient documentation

## 2017-05-03 HISTORY — DX: Other disorders of pituitary gland: E23.6

## 2017-05-03 LAB — URINALYSIS, COMPLETE (UACMP) WITH MICROSCOPIC
Bacteria, UA: NONE SEEN
Bilirubin Urine: NEGATIVE
GLUCOSE, UA: NEGATIVE mg/dL
Ketones, ur: 5 mg/dL — AB
Nitrite: NEGATIVE
PH: 7 (ref 5.0–8.0)
PROTEIN: NEGATIVE mg/dL
Specific Gravity, Urine: 1.003 — ABNORMAL LOW (ref 1.005–1.030)

## 2017-05-03 LAB — CBC
HCT: 44.3 % (ref 35.0–47.0)
Hemoglobin: 14.8 g/dL (ref 12.0–16.0)
MCH: 28.4 pg (ref 26.0–34.0)
MCHC: 33.5 g/dL (ref 32.0–36.0)
MCV: 84.8 fL (ref 80.0–100.0)
PLATELETS: 239 10*3/uL (ref 150–440)
RBC: 5.23 MIL/uL — AB (ref 3.80–5.20)
RDW: 14.4 % (ref 11.5–14.5)
WBC: 13.4 10*3/uL — AB (ref 3.6–11.0)

## 2017-05-03 LAB — COMPREHENSIVE METABOLIC PANEL
ALK PHOS: 62 U/L (ref 38–126)
ALT: 19 U/L (ref 14–54)
AST: 25 U/L (ref 15–41)
Albumin: 4.6 g/dL (ref 3.5–5.0)
Anion gap: 10 (ref 5–15)
BUN: 10 mg/dL (ref 6–20)
CALCIUM: 8.8 mg/dL — AB (ref 8.9–10.3)
CO2: 24 mmol/L (ref 22–32)
CREATININE: 0.83 mg/dL (ref 0.44–1.00)
Chloride: 102 mmol/L (ref 101–111)
Glucose, Bld: 102 mg/dL — ABNORMAL HIGH (ref 65–99)
Potassium: 3.8 mmol/L (ref 3.5–5.1)
Sodium: 136 mmol/L (ref 135–145)
Total Bilirubin: 1.3 mg/dL — ABNORMAL HIGH (ref 0.3–1.2)
Total Protein: 8 g/dL (ref 6.5–8.1)

## 2017-05-03 LAB — LIPASE, BLOOD: Lipase: 30 U/L (ref 11–51)

## 2017-05-03 MED ORDER — METRONIDAZOLE 500 MG PO TABS: 500.0000 mg | ORAL_TABLET | Freq: Three times a day (TID) | ORAL | 0 refills | Status: AC

## 2017-05-03 MED ORDER — SODIUM CHLORIDE 0.9 % IV BOLUS
1000.0000 mL | Freq: Once | INTRAVENOUS | Status: AC
  Administered 2017-05-03: 1000 mL via INTRAVENOUS

## 2017-05-03 MED ORDER — OXYCODONE-ACETAMINOPHEN 5-325 MG PO TABS: 1.0000 | ORAL_TABLET | ORAL | 0 refills | Status: DC | PRN

## 2017-05-03 MED ORDER — AMOXICILLIN-POT CLAVULANATE 875-125 MG PO TABS: 1.0000 | ORAL_TABLET | Freq: Two times a day (BID) | ORAL | 0 refills | Status: AC

## 2017-05-03 MED ORDER — ONDANSETRON 4 MG PO TBDP: 4.0000 mg | ORAL_TABLET | Freq: Three times a day (TID) | ORAL | 0 refills | Status: DC | PRN

## 2017-05-03 MED ORDER — IOPAMIDOL (ISOVUE-370) INJECTION 76%
75.0000 mL | Freq: Once | INTRAVENOUS | Status: AC | PRN
  Administered 2017-05-03: 75 mL via INTRAVENOUS

## 2017-05-03 MED ORDER — ONDANSETRON HCL 4 MG/2ML IJ SOLN
4.0000 mg | Freq: Once | INTRAMUSCULAR | Status: AC
  Administered 2017-05-03: 4 mg via INTRAVENOUS
  Filled 2017-05-03: qty 2

## 2017-05-03 MED ORDER — ACETAMINOPHEN 500 MG PO TABS
1000.0000 mg | ORAL_TABLET | Freq: Once | ORAL | Status: AC
  Administered 2017-05-03: 1000 mg via ORAL
  Filled 2017-05-03: qty 2

## 2017-05-03 MED ORDER — PIPERACILLIN-TAZOBACTAM 3.375 G IVPB 30 MIN
3.3750 g | Freq: Once | INTRAVENOUS | Status: AC
  Administered 2017-05-03: 3.375 g via INTRAVENOUS
  Filled 2017-05-03: qty 50

## 2017-05-03 MED ORDER — MORPHINE SULFATE (PF) 4 MG/ML IV SOLN
4.0000 mg | Freq: Once | INTRAVENOUS | Status: AC
  Administered 2017-05-03: 4 mg via INTRAVENOUS
  Filled 2017-05-03: qty 1

## 2017-05-03 MED ORDER — OXYCODONE HCL 5 MG PO TABS
5.0000 mg | ORAL_TABLET | Freq: Once | ORAL | Status: AC
  Administered 2017-05-03: 5 mg via ORAL
  Filled 2017-05-03: qty 1

## 2017-05-03 NOTE — ED Notes (Signed)
Patient to room 11 instructed to put on gown, Tresa Endo RN aware of room placement.

## 2017-05-03 NOTE — ED Notes (Signed)
Pt back from CT at this time 

## 2017-05-03 NOTE — ED Triage Notes (Signed)
Patient to ED from Dr. Hilda Blades office.  Complaining of right lower abdominal pain radiating across abdomen.  Describes pain as "shooting".  "If you touch anywhere on my stomach or upper thigh" it hurts.  Hx of ureter reimplantation in her 30's and bladder tack. Right ooperhectomy and a hysterectomy.  Patient states "I've had umpteen laparoscopies".  Alert and oriented.  Crying.

## 2017-05-03 NOTE — ED Provider Notes (Signed)
[facility] Emergency Department Provider Note  ____________________________________________  Time seen: Approximately 9:47 AM  I have reviewed the triage vital signs and the nursing notes.   HISTORY  Chief Complaint Abdominal Pain   HPI Linda Walker is a 52 y.o. 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  Patient reports the pain started 2 days ago and is gotten progressively worse.  The pain is sharp, constant, located in the right lower quadrant, radiating to the rest of her abdomen and to her right lower back, currently 10 out of 10.  She has had nausea and a fever of 100.13F at home yesterday.  No vomiting, no diarrhea, no constipation, no chest pain or shortness of breath, no URI symptoms, no dysuria hematuria, no vaginal bleeding.  Patient is status post menopause.  Past Medical History:  Diagnosis Date  . Pituitary mass (HCC)   . Seizure (HCC) 2006  . TIA (transient ischemic attack) 2006    Patient Active Problem List   Diagnosis Date Noted  . 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 ABDOMINAL HYSTERECTOMY  1997  . BLADDER SUSPENSION  2005 ?  Marland Kitchen KIDNEY SURGERY    . PARTIAL HYSTERECTOMY      Prior to Admission medications   Medication Sig Start Date End Date Taking? Authorizing Provider  amoxicillin-clavulanate (AUGMENTIN) 875-125 MG tablet Take 1 tablet by mouth 2 (two) times daily for 10 days. 05/03/17 05/13/17  Nita Sickle, MD  metroNIDAZOLE (FLAGYL) 500 MG tablet Take 1 tablet (500 mg total) by mouth 3 (three) times daily for 10 days. 05/03/17 05/13/17  Nita Sickle, MD  ondansetron (ZOFRAN ODT) 4 MG disintegrating tablet Take 1 tablet (4 mg total) by mouth every 8 (eight) hours as needed for nausea or vomiting. 05/03/17    Nita Sickle, MD  oxyCODONE-acetaminophen (PERCOCET) 5-325 MG tablet Take 1 tablet by mouth every 4 (four) hours as needed for severe pain. 05/03/17   Nita Sickle, MD  traMADol (ULTRAM) 50 MG tablet Take 1 tablet (50 mg total) by mouth every 6 (six) hours as needed. Patient not taking: Reported on 05/03/2017 04/02/16   Cuthriell, Delorise Royals, PA-C    Allergies Patient has no known allergies.  Family History  Problem Relation Age of Onset  . Hypertension Mother   . Cancer Father   . Diabetes Father   . Hypertension Father     Social History Social History   Tobacco Use  . Smoking status: Current Every Day Smoker    Packs/day: 0.25    Types: Cigarettes  . Smokeless tobacco: Never Used  Substance Use Topics  . Alcohol use: No  . Drug use: No    Review of Systems  Constitutional: + fever. Eyes: Negative for visual changes. ENT: Negative for sore throat. Neck: No neck pain  Cardiovascular: Negative for chest pain. Respiratory: Negative for shortness of breath. Gastrointestinal: + RLQ abdominal pain and nausea. No vomiting or diarrhea. Genitourinary: Negative for dysuria. Musculoskeletal: Negative for back pain. Skin: Negative for rash. Neurological: Negative for headaches, weakness or numbness. Psych: No SI or HI  ____________________________________________   PHYSICAL EXAM:  VITAL SIGNS: ED Triage Vitals  Enc Vitals Group     BP 05/03/17 0903 (!) 121/59     Pulse Rate 05/03/17 0903 87     Resp 05/03/17 0903 20  Temp 05/03/17 0903 99.1 F (37.3 C)     Temp Source 05/03/17 0903 Oral     SpO2 05/03/17 0903 97 %     Weight 05/03/17 0904 135 lb (61.2 kg)     Height 05/03/17 0904  (1.651 m)     Head Circumference --      Peak Flow --      Pain Score 05/03/17 0915 10     Pain Loc --      Pain Edu? --      Excl. in GC? --     Constitutional: Alert and oriented. Well appearing and in no apparent distress. HEENT:      Head: Normocephalic and  atraumatic.         Eyes: Conjunctivae are normal. Sclera is non-icteric.       Mouth/Throat: Mucous membranes are moist.       Neck: Supple with no signs of meningismus. Cardiovascular: Regular rate and rhythm. No murmurs, gallops, or rubs. 2+ symmetrical distal pulses are present in all extremities. No JVD. Respiratory: Normal respiratory effort. Lungs are clear to auscultation bilaterally. No wheezes, crackles, or rhonchi.  Gastrointestinal: Soft, diffusely tender to palpation worse on the right lower quadrant with localized guarding, no rebound, and positive Rovsing sign Genitourinary: No CVA tenderness. Musculoskeletal: Nontender with normal range of motion in all extremities. No edema, cyanosis, or erythema of extremities. Neurologic: Normal speech and language. Face is symmetric. Moving all extremities. No gross focal neurologic deficits are appreciated. Skin: Skin is warm, dry and intact. No rash noted. Psychiatric: Mood and affect are normal. Speech and behavior are normal.  ____________________________________________   LABS (all labs ordered are listed, but only abnormal results are displayed)  Labs Reviewed  COMPREHENSIVE METABOLIC PANEL - Abnormal; Notable for the following components:      Result Value   Glucose, Bld 102 (*)    Calcium 8.8 (*)    Total Bilirubin 1.3 (*)    All other components within normal limits  CBC - Abnormal; Notable for the following components:   WBC 13.4 (*)    RBC 5.23 (*)    All other components within normal limits  URINALYSIS, COMPLETE (UACMP) WITH MICROSCOPIC - Abnormal; Notable for the following components:   Color, Urine YELLOW (*)    APPearance CLEAR (*)    Specific Gravity, Urine 1.003 (*)    Hgb urine dipstick MODERATE (*)    Ketones, ur 5 (*)    Leukocytes, UA TRACE (*)    All other components within normal limits  LIPASE, BLOOD    ____________________________________________  EKG  none ____________________________________________  RADIOLOGY  I have personally reviewed the images performed during this visit and I agree with the Radiologist's read.   Interpretation by Radiologist:  Ct Abdomen Pelvis W Contrast  Result Date: 05/03/2017 CLINICAL DATA:  Right lower quadrant abdominal pain. EXAM: CT ABDOMEN AND PELVIS WITH CONTRAST TECHNIQUE: Multidetector CT imaging of the abdomen and pelvis was performed using the standard protocol following bolus administration of intravenous contrast. CONTRAST:  75mL ISOVUE-370 IOPAMIDOL (ISOVUE-370) INJECTION 76% COMPARISON:  10/28/2014 FINDINGS: Lower chest: Lung bases are clear. No effusions. Heart is normal size. Hepatobiliary: No focal hepatic abnormality. Gallbladder unremarkable. Pancreas: No focal abnormality or ductal dilatation. Spleen: No focal abnormality.  Normal size. Adrenals/Urinary Tract: No adrenal abnormality. No focal renal abnormality. No stones or hydronephrosis. Urinary bladder is unremarkable. Stomach/Bowel: Sigmoid diverticulosis. Inflammatory stranding noted around the mid sigmoid colon compatible with active diverticulitis. Appendix is normal.  Stomach and small bowel decompressed, unremarkable. Vascular/Lymphatic: Aortic atherosclerosis. No enlarged abdominal or pelvic lymph nodes. Reproductive: Prior hysterectomy.  No adnexal masses. Other: Small amount of free fluid in the pelvis.  No free air. Musculoskeletal: No acute bony abnormality. IMPRESSION: Sigmoid diverticulosis. Inflammatory stranding around the mid sigmoid colon compatible with active acute diverticulitis. Small amount of free fluid in the pelvis. Normal appendix. Electronically Signed   By: Charlett Nose M.D.   On: 05/03/2017 10:54      ____________________________________________   PROCEDURES  Procedure(s) performed: None Procedures Critical Care performed:   None ____________________________________________   INITIAL IMPRESSION / ASSESSMENT AND PLAN / ED COURSE  52 y.o. 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  Patient endorses nausea and fever at home.  She is in obvious distress, has a low-grade temp of 99.1, remainder of her vital signs are within normal limits.  Patient has diffuse tenderness to palpation her abdomen which is worse on the right lower quadrant with localized guarding and positive Rovsing sign.  Differential diagnoses including appendicitis versus kidney stones versus pyelonephritis versus complications from her ureteral reimplantation versus diverticulitis versus colitis.  Labs showing leukocytosis with white count of 13.4.  Normal CMP and lipase.  UA showing moderate hemoglobin with 5 ketones but no evidence of infection and no RBCs.  CT is pending.   ED COURSE: CT was consistent with uncomplicated diverticulitis. Patient was given zosyn and dc home on flagyl and augmentin. Patient was tolerating PO and pain was well controlled with PO meds. Return precautions discussed with patient.    As part of my medical decision making, I reviewed the following data within the electronic MEDICAL RECORD NUMBER Nursing notes reviewed and incorporated, Labs reviewed , Old chart reviewed, Radiograph reviewed , Notes from prior ED visits and [place] Controlled Substance Database    Pertinent labs & imaging results that were available during my care of the patient were reviewed by me and considered in my medical decision making (see chart for details).    ____________________________________________   FINAL CLINICAL IMPRESSION(S) / ED DIAGNOSES  Final diagnoses:  Diverticulitis      NEW MEDICATIONS STARTED DURING THIS VISIT:  ED Discharge Orders        Ordered     oxyCODONE-acetaminophen (PERCOCET) 5-325 MG tablet  Every 4 hours PRN     05/03/17 1242    ondansetron (ZOFRAN ODT) 4 MG disintegrating tablet  Every 8 hours PRN     05/03/17 1242    metroNIDAZOLE (FLAGYL) 500 MG tablet  3 times daily     05/03/17 1242    amoxicillin-clavulanate (AUGMENTIN) 875-125 MG tablet  2 times daily     05/03/17 1242       Note:  This document was prepared using Dragon voice recognition software and may include unintentional dictation errors.    Nita Sickle, MD 05/03/17 [phone]

## 2017-06-21 ENCOUNTER — Other Ambulatory Visit
Admission: RE | Admit: 2017-06-21 | Discharge: 2017-06-21 | Disposition: A | Discharge status: Home / Self Care | Payer: BC Managed Care – PPO | Source: Ambulatory Visit | Attending: Student | Admitting: Student

## 2017-06-21 ENCOUNTER — Other Ambulatory Visit: Payer: Self-pay | Admitting: Student

## 2017-06-21 DIAGNOSIS — R197 Diarrhea, unspecified: Secondary | ICD-10-CM | POA: Insufficient documentation

## 2017-06-21 DIAGNOSIS — R1084 Generalized abdominal pain: Secondary | ICD-10-CM

## 2017-06-21 DIAGNOSIS — Z8719 Personal history of other diseases of the digestive system: Secondary | ICD-10-CM

## 2017-06-21 LAB — GASTROINTESTINAL PANEL BY PCR, STOOL (REPLACES STOOL CULTURE)

## 2017-06-21 LAB — C DIFFICILE QUICK SCREEN W PCR REFLEX
C DIFFICILE (CDIFF) TOXIN: NEGATIVE
C DIFFICLE (CDIFF) ANTIGEN: NEGATIVE
C Diff interpretation: NOT DETECTED

## 2017-06-23 LAB — H. PYLORI ANTIGEN, STOOL: H. Pylori Stool Ag, Eia: NEGATIVE

## 2017-06-23 LAB — CALPROTECTIN, FECAL: Calprotectin, Fecal: <16 ug/g (ref 0–120)

## 2017-06-23 LAB — PANCREATIC ELASTASE, FECAL

## 2017-06-29 ENCOUNTER — Ambulatory Visit
Admission: RE | Admit: 2017-06-29 | Discharge: 2017-06-29 | Disposition: A | Discharge status: Home / Self Care | Payer: BC Managed Care – PPO | Source: Ambulatory Visit | Attending: Student | Admitting: Student

## 2017-06-29 DIAGNOSIS — Z8719 Personal history of other diseases of the digestive system: Secondary | ICD-10-CM | POA: Insufficient documentation

## 2017-06-29 DIAGNOSIS — I700 Atherosclerosis of aorta: Secondary | ICD-10-CM | POA: Diagnosis not present

## 2017-06-29 DIAGNOSIS — R1084 Generalized abdominal pain: Secondary | ICD-10-CM | POA: Insufficient documentation

## 2017-06-29 MED ORDER — IOPAMIDOL (ISOVUE-300) INJECTION 61%
100.0000 mL | Freq: Once | INTRAVENOUS | Status: AC | PRN
  Administered 2017-06-29: 100 mL via INTRAVENOUS

## 2017-07-21 ENCOUNTER — Other Ambulatory Visit: Payer: Self-pay | Admitting: Student

## 2017-07-21 DIAGNOSIS — R1011 Right upper quadrant pain: Secondary | ICD-10-CM

## 2017-08-01 ENCOUNTER — Emergency Department
Admission: EM | Admit: 2017-08-01 | Discharge: 2017-08-01 | Disposition: A | Discharge status: Home / Self Care | Payer: BC Managed Care – PPO | Attending: Emergency Medicine | Admitting: Emergency Medicine

## 2017-08-01 ENCOUNTER — Emergency Department: Payer: BC Managed Care – PPO

## 2017-08-01 ENCOUNTER — Other Ambulatory Visit: Payer: Self-pay

## 2017-08-01 DIAGNOSIS — R1011 Right upper quadrant pain: Secondary | ICD-10-CM

## 2017-08-01 DIAGNOSIS — Z8673 Personal history of transient ischemic attack (TIA), and cerebral infarction without residual deficits: Secondary | ICD-10-CM | POA: Insufficient documentation

## 2017-08-01 DIAGNOSIS — R1031 Right lower quadrant pain: Secondary | ICD-10-CM | POA: Insufficient documentation

## 2017-08-01 DIAGNOSIS — F17210 Nicotine dependence, cigarettes, uncomplicated: Secondary | ICD-10-CM | POA: Insufficient documentation

## 2017-08-01 DIAGNOSIS — K8050 Calculus of bile duct without cholangitis or cholecystitis without obstruction: Secondary | ICD-10-CM | POA: Insufficient documentation

## 2017-08-01 LAB — URINALYSIS, COMPLETE (UACMP) WITH MICROSCOPIC
Bilirubin Urine: NEGATIVE
Glucose, UA: NEGATIVE mg/dL
Hgb urine dipstick: NEGATIVE
Ketones, ur: 5 mg/dL — AB
Nitrite: NEGATIVE
PH: 6 (ref 5.0–8.0)
Protein, ur: NEGATIVE mg/dL
SPECIFIC GRAVITY, URINE: 1.009 (ref 1.005–1.030)

## 2017-08-01 LAB — COMPREHENSIVE METABOLIC PANEL
ALBUMIN: 4.9 g/dL (ref 3.5–5.0)
ALT: 17 U/L (ref 0–44)
AST: 22 U/L (ref 15–41)
Alkaline Phosphatase: 60 U/L (ref 38–126)
Anion gap: 7 (ref 5–15)
BUN: 12 mg/dL (ref 6–20)
CHLORIDE: 107 mmol/L (ref 98–111)
CO2: 27 mmol/L (ref 22–32)
CREATININE: 0.59 mg/dL (ref 0.44–1.00)
Calcium: 9.2 mg/dL (ref 8.9–10.3)
GFR calc Af Amer: >60 mL/min (ref >60)
GFR calc non Af Amer: >60 mL/min (ref >60)
GLUCOSE: 97 mg/dL (ref 70–99)
POTASSIUM: 4.5 mmol/L (ref 3.5–5.1)
Sodium: 141 mmol/L (ref 135–145)
Total Bilirubin: 0.8 mg/dL (ref 0.3–1.2)
Total Protein: 7.6 g/dL (ref 6.5–8.1)

## 2017-08-01 LAB — CBC
HEMATOCRIT: 45.5 % (ref 35.0–47.0)
Hemoglobin: 15.2 g/dL (ref 12.0–16.0)
MCH: 28.9 pg (ref 26.0–34.0)
MCHC: 33.4 g/dL (ref 32.0–36.0)
MCV: 86.4 fL (ref 80.0–100.0)
PLATELETS: 223 10*3/uL (ref 150–440)
RBC: 5.27 MIL/uL — ABNORMAL HIGH (ref 3.80–5.20)
RDW: 13.3 % (ref 11.5–14.5)
WBC: 9.8 10*3/uL (ref 3.6–11.0)

## 2017-08-01 LAB — LIPASE, BLOOD: LIPASE: 39 U/L (ref 11–51)

## 2017-08-01 MED ORDER — GI COCKTAIL ~~LOC~~
30.0000 mL | Freq: Once | ORAL | Status: AC
  Administered 2017-08-01: 30 mL via ORAL
  Filled 2017-08-01: qty 30

## 2017-08-01 MED ORDER — MORPHINE SULFATE (PF) 4 MG/ML IV SOLN
4.0000 mg | Freq: Once | INTRAVENOUS | Status: AC
  Administered 2017-08-01: 4 mg via INTRAVENOUS
  Filled 2017-08-01: qty 1

## 2017-08-01 MED ORDER — SODIUM CHLORIDE 0.9 % IV BOLUS
1000.0000 mL | Freq: Once | INTRAVENOUS | Status: AC
  Administered 2017-08-01: 1000 mL via INTRAVENOUS

## 2017-08-01 MED ORDER — CEPHALEXIN 500 MG PO CAPS: 500.0000 mg | ORAL_CAPSULE | Freq: Three times a day (TID) | ORAL | 0 refills | Status: AC

## 2017-08-01 MED ORDER — TRAMADOL HCL 50 MG PO TABS: 50.0000 mg | ORAL_TABLET | Freq: Four times a day (QID) | ORAL | 0 refills | Status: AC | PRN

## 2017-08-01 MED ORDER — IOHEXOL 300 MG/ML  SOLN
100.0000 mL | Freq: Once | INTRAMUSCULAR | Status: AC | PRN
  Administered 2017-08-01: 100 mL via INTRAVENOUS
  Filled 2017-08-01: qty 100

## 2017-08-01 MED ORDER — ONDANSETRON HCL 4 MG/2ML IJ SOLN
4.0000 mg | Freq: Once | INTRAMUSCULAR | Status: AC
  Administered 2017-08-01: 4 mg via INTRAVENOUS
  Filled 2017-08-01: qty 2

## 2017-08-01 NOTE — ED Notes (Signed)
Patient transported to XR. 

## 2017-08-01 NOTE — ED Provider Notes (Signed)
[facility] Emergency Department Provider Note  ____________________________________________   First MD Initiated Contact with Patient 08/01/17 1431     (approximate)  I have reviewed the triage vital signs and the nursing notes.   HISTORY  Chief Complaint Abdominal Pain    HPI Linda Walker is a 52 y.o. female presents emergency department complaining of right upper quadrant pain that radiates to the lower right side.  She states she has had these symptoms since yesterday and has had diarrhea.  She states she had a history of diverticulitis.  The surgeon told her at that time she would need to have her gallbladder taken out.  That was 15 years ago.  She states she never feels the test that the insurance needs for her to have her gallbladder removed.  She denies any vomiting today.    Past Medical History:  Diagnosis Date  . 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 Seizure ([place]) 2006  . TIA (transient ischemic attack) 2006    Patient Active Problem List   Diagnosis Date Noted  . 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 ABDOMINAL HYSTERECTOMY  1997  . BLADDER SUSPENSION  2005 ?  [facility]ID[facility] SURGERY    . PARTIAL HYSTERECTOMY      Prior to Admission medications   Medication Sig Start Date End Date Taking? Authorizing Provider  ondansetron (ZOFRAN ODT) 4 MG disintegrating tablet Take 1 tablet (4 mg total) by mouth every 8 (eight) hours as needed for nausea or vomiting. 05/03/17   Rudene Re, MD  oxyCODONE-acetaminophen (PERCOCET) 5-325 MG tablet Take 1 tablet by mouth every 4 (four) hours as needed for severe pain. 05/03/17   Rudene Re, MD  traMADol (ULTRAM) 50 MG tablet Take 1 tablet (50 mg total) by mouth every 6 (six) hours as needed. Patient not taking: Reported on 05/03/2017 04/02/16   Cuthriell, Charline Bills, PA-C    Allergies Patient has no known allergies.  Family History  Problem Relation Age of  Onset  . Hypertension Mother   . Cancer Father   . Diabetes Father   . Hypertension Father     Social History Social History   Tobacco Use  . Smoking status: Current Every Day Smoker    Packs/day: 0.25    Types: Cigarettes  . Smokeless tobacco: Never Used  Substance Use Topics  . Alcohol use: No  . Drug use: No    Review of Systems  Constitutional: Positive fever/chills Eyes: No visual changes. ENT: No sore throat. Respiratory: Denies cough Abdomen: Positive right upper quadrant pain Genitourinary: Negative for dysuria. Musculoskeletal: Negative for back pain. Skin: Negative for rash.    ____________________________________________   PHYSICAL EXAM:  VITAL SIGNS: ED Triage Vitals  Enc Vitals Group     BP 08/01/17 1111 (!) 113/57     Pulse Rate 08/01/17 1111 69     Resp --      Temp 08/01/17 1111 100 F (37.8 C)     Temp Source 08/01/17 1111 Oral     SpO2 08/01/17 1111 100 %     Weight 08/01/17 1111 125 lb (56.7 kg)     Height 08/01/17 1111 5' 5"  (1.651 m)     Head Circumference --      Peak Flow --      Pain Score 08/01/17 1119 8     Pain Loc --      Pain Edu? --  Excl. in [place]? --     Constitutional: Alert and oriented. Well appearing and in no acute distress. Eyes: Conjunctivae are normal.  Head: Atraumatic. Nose: No congestion/rhinnorhea. Mouth/Throat: Mucous membranes are moist.   Neck:  supple no lymphadenopathy noted Cardiovascular: Normal rate, regular rhythm. Heart sounds are normal Respiratory: Normal respiratory effort.  No retractions, lungs c t a  Abd: soft tender in the right upper quadrant, mildly tender in the right lower quadrant, left quadrants are not tender.  All sounds are normal GU: deferred Musculoskeletal: FROM all extremities, warm and well perfused Neurologic:  Normal speech and language.  Skin:  Skin is warm, dry and intact. No rash noted. Psychiatric: Mood and affect are normal. Speech and behavior are  normal.  ____________________________________________   LABS (all labs ordered are listed, but only abnormal results are displayed)  Labs Reviewed  CBC - Abnormal; Notable for the following components:      Result Value   RBC 5.27 (*)    All other components within normal limits  URINALYSIS, COMPLETE (UACMP) WITH MICROSCOPIC - Abnormal; Notable for the following components:   Color, Urine YELLOW (*)    APPearance HAZY (*)    Ketones, ur 5 (*)    Leukocytes, UA SMALL (*)    Bacteria, UA RARE (*)    All other components within normal limits  LIPASE, BLOOD  COMPREHENSIVE METABOLIC PANEL   ____________________________________________   ____________________________________________  RADIOLOGY  Ultrasound of the right upper quadrant is negative CT abdomen and pelvis with contrast ordered  ____________________________________________   PROCEDURES  Procedure(s) performed: Saline lock, morphine 4 mg IV Zofran 4 mg IV saline 1 L bolus  Procedures    ____________________________________________   INITIAL IMPRESSION / ASSESSMENT AND PLAN / ED COURSE  Pertinent labs & imaging results that were available during my care of the patient were reviewed by me and considered in my medical decision making (see chart for details).   Patient is 52 year old female presents emergency department complaining of right upper quadrant pain.  On physical exam patient does appear to be uncomfortable.  The right upper quadrant is tender to palpation.  The right lower quadrant is minimally tender to palpation.  Comprehensive metabolic panel is normal, CBC has a normal WBC, lipase is 39, urinalysis shows ketones of 5, small amount of leuks, and rare bacteria.  Ultrasound of the right upper quadrant was ordered Ultrasound is negative  ct abdomen pelvis ordered  Discussed the patient with Kern Reap, PA-C.  She will be taken over the care of this patient at this time.      As part of my  medical decision making, I reviewed the following data within the [facility] notes reviewed and incorporated, Labs reviewed CBC, met C are normal, lipase is normal, urinalysis shows small amount of leuks and rare bacteria, Old chart reviewed, Patient signed out to Kern Reap, PA-C, Radiograph reviewed ultrasound the right upper quadrant is negative, Notes from prior ED visits and [place] Controlled Substance Database  ____________________________________________   FINAL CLINICAL IMPRESSION(S) / ED DIAGNOSES  Final diagnoses:  RUQ pain      NEW MEDICATIONS STARTED DURING THIS VISIT:  New Prescriptions   No medications on file     Note:  This document was prepared using Dragon voice recognition software and may include unintentional dictation errors.    Versie Starks, PA-C 08/01/17 1603    Schaevitz, Randall An, MD 08/03/17 [phone]

## 2017-08-01 NOTE — ED Provider Notes (Signed)
  Physical Exam  BP (!) 99/48 (BP Location: Right Arm)   Pulse (!) 52   Temp 100 F (37.8 C) (Oral)   Resp 16   Ht 5\' 5"  (1.651 m)   Wt 56.7 kg (125 lb)   SpO2 100%   BMI 20.80 kg/m   Physical Exam  ED Course/Procedures     Procedures  MDM  Assumed care for Linda RightSusan Fisher, Linda Walker.  Patient reported 10 out of 10 right upper quadrant abdominal pain radiating to right lower quadrant for 1 day in association with diarrhea.  Patient reports that abdominal pain is worsened after eating.  She is awaiting HIDA scan scheduled by GI.  Patient underwent a CT abdomen and right upper quadrant ultrasound in the emergency department which were reassuring and noncontributory for acute abnormality.  Patient was given morphine in the emergency department and patient reported that her pain improved mildly.  Patient was given a GI cocktail ordered by myself and I ordered a DG chest to rule out community-acquired pneumonia.  Chest x-ray was reassuring.  Patient was discharged with a short course of tramadol and patient education regarding biliary colic was given.  Patient was advised to avoid greasy foods.  Follow-up with GI was recommended.       Linda Walker, Linda Bow[place] York[place]own[place], Linda Walker 08/01/17 1805    Linda Walker, Linda Walker, Linda Walker 08/01/17 [phone]

## 2017-08-01 NOTE — ED Notes (Signed)
See triage note  Presents with lower abd pain which started yesterday   abd is slightly tender on right side

## 2017-08-01 NOTE — ED Triage Notes (Signed)
Pt c/o RUQ pain that radiates into the lower abd since yesterday with diarrhea.

## 2017-08-01 NOTE — ED Notes (Addendum)
Pt ambulatory to wheel chair upon discharge. Verbalized understanding of discharge instructions, follow-up care and prescription. VSS. Skin warm and dry. A&O x4. This RN wheeled pt out to lobby; waiting for family to pick her up.  This RN called lab and confirmed that they could add on urine culture to urinalysis already sent.

## 2017-08-03 LAB — URINE CULTURE: Culture: <10000 — AB

## 2017-08-05 ENCOUNTER — Ambulatory Visit
Admission: RE | Admit: 2017-08-05 | Discharge: 2017-08-05 | Disposition: A | Discharge status: Home / Self Care | Payer: BC Managed Care – PPO | Source: Ambulatory Visit | Attending: Student | Admitting: Student

## 2017-08-05 DIAGNOSIS — R1011 Right upper quadrant pain: Secondary | ICD-10-CM | POA: Diagnosis present

## 2017-08-05 MED ORDER — TECHNETIUM TC 99M MEBROFENIN IV KIT
5.2400 | PACK | Freq: Once | INTRAVENOUS | Status: AC | PRN
  Administered 2017-08-05: 5.24 via INTRAVENOUS

## 2017-08-09 ENCOUNTER — Other Ambulatory Visit: Payer: Self-pay | Admitting: Surgery

## 2017-08-09 DIAGNOSIS — K551 Chronic vascular disorders of intestine: Secondary | ICD-10-CM

## 2017-08-15 ENCOUNTER — Ambulatory Visit
Admission: RE | Admit: 2017-08-15 | Discharge: 2017-08-15 | Disposition: A | Discharge status: Home / Self Care | Payer: BC Managed Care – PPO | Source: Ambulatory Visit | Attending: Surgery | Admitting: Surgery

## 2017-08-15 DIAGNOSIS — I700 Atherosclerosis of aorta: Secondary | ICD-10-CM | POA: Insufficient documentation

## 2017-08-15 DIAGNOSIS — K551 Chronic vascular disorders of intestine: Secondary | ICD-10-CM

## 2017-08-15 MED ORDER — IOPAMIDOL (ISOVUE-370) INJECTION 76%
75.0000 mL | Freq: Once | INTRAVENOUS | Status: AC | PRN
  Administered 2017-08-15: 75 mL via INTRAVENOUS

## 2017-08-22 ENCOUNTER — Other Ambulatory Visit: Payer: Self-pay

## 2017-08-22 ENCOUNTER — Ambulatory Visit: Payer: Self-pay | Admitting: Surgery

## 2017-08-22 ENCOUNTER — Encounter
Admission: RE | Admit: 2017-08-22 | Discharge: 2017-08-22 | Disposition: A | Discharge status: Home / Self Care | Payer: BC Managed Care – PPO | Source: Ambulatory Visit | Attending: Surgery | Admitting: Surgery

## 2017-08-22 HISTORY — DX: Hypotension, unspecified: I95.9

## 2017-08-22 HISTORY — DX: Headache: R51

## 2017-08-22 HISTORY — DX: Gastro-esophageal reflux disease without esophagitis: K21.9

## 2017-08-22 HISTORY — DX: Other complications of anesthesia, initial encounter: T88.59XA

## 2017-08-22 HISTORY — DX: Personal history of urinary calculi: Z87.442

## 2017-08-22 HISTORY — DX: Diverticulitis of intestine, part unspecified, without perforation or abscess without bleeding: K57.92

## 2017-08-22 HISTORY — DX: Headache, unspecified: R51.9

## 2017-08-22 HISTORY — DX: Adverse effect of unspecified anesthetic, initial encounter: T41.45XA

## 2017-08-22 NOTE — H&P (Signed)
CC: Mesenteric ischemia, chronic (CMS-HCC) [K55.1]  HPI:  Linda Walker is a 52 y.o. female who was referred by Vilma Meckel* for evaluation of above CC. Symptoms were first noted 3 months ago. Pain is achy and intermittent, periumbilical region and also along the right side.  Associated with eating and BMs, happens every single time she eats or drinks,  exacerbated by above.  Pain lasts anywhere from to couple hours. She has lost wt to due to not being able to eat.  She has tried different diet and food consistency and no difference in symptoms.  She has diarrhea and constipation.  Extensive workup by GI including serum tests and EGD, colonoscopy has only yielded gastritis.     Past Medical History:  has no past medical history on file.  Past Surgical History:  has a past surgical history that includes Hysterectomy Vaginal; Resection Transurethral Bladder Neck; egd (07/03/2017); and Colonoscopy (07/03/2017).  Family History: family history is not on file.  Social History:  reports that she has been smoking cigarettes.  She has never used smokeless tobacco. She reports that she drank alcohol. Her drug history is not on file.  Current Medications: has a current medication list which includes the following prescription(s): acetaminophen, cephalexin, and omeprazole.  Allergies:       Allergies as of 08/09/2017 - Reviewed 08/09/2017  Allergen Reaction Noted  . Ketek [telithromycin] Vomiting   . Sulfa (sulfonamide antibiotics) Vomiting 06/20/2017    ROS:  A 15 point review of systems was performed and pertinent positives and negatives noted in HPI    Objective:   BP 105/59   Pulse 80   Temp 36.9 C (98.5 F) (Oral)   Ht 165.1 cm (5\' 5" )   Wt 58.1 kg (128 lb)   BMI 21.30 kg/m    Constitutional :  alert, appears stated age, cooperative and no distress  Lymphatics/Throat:  no asymmetry, masses, or scars  Respiratory:  clear to auscultation  bilaterally  Cardiovascular:  regular rate and rhythm, S1, S2 normal, no murmur, click, rub or gallop  Gastrointestinal: soft, thin, with tenderness in periumbilical region and along entire right side of abdomen, along descending colon and half of transverese colon.  Right groin tenderness noted as well, but no palpable hernia, even in standing position.  the entire left side of the abdomen is non-tender.  no guarding or rebound tenderness..    Musculoskeletal: Steady gait and movement  Skin: Cool and moist  Psychiatric: Normal affect, non-agitated, not confused       LABS:  -      Lab Results  Component Value Date   WBC 9.7 06/20/2017   HGB 15.5 (H) 06/20/2017   HCT 47.5 (H) 06/20/2017   PLT 225 06/20/2017   -      Lab Results  Component Value Date   NA 143 06/20/2017   K 4.3 06/20/2017   CL 106 06/20/2017   CO2 32.2 (H) 06/20/2017   BUN 7 06/20/2017   CREATININE 0.6 06/20/2017   GLUCOSE 103 06/20/2017   -      Lab Results  Component Value Date   NA 143 06/20/2017   K 4.3 06/20/2017   CL 106 06/20/2017   CO2 32.2 (H) 06/20/2017   BUN 7 06/20/2017   CREATININE 0.6 06/20/2017   CALCIUM 9.5 06/20/2017   ALB 4.7 06/20/2017   TBILI 0.6 06/20/2017   ALKPHOS 50 06/20/2017   AST 18 06/20/2017   ALT 14 06/20/2017  GLUCOSE 103 06/20/2017   GFR 104 06/20/2017   -      Lab Results  Component Value Date   CHOLTOTAL 177 07/20/2017   TRIG 80 07/20/2017   HDL 52.7 07/20/2017   LDLCALC 108 07/20/2017   -      Lab Results  Component Value Date   AST 18 06/20/2017   ALT 14 06/20/2017   ALKPHOS 50 06/20/2017   TBILI 0.6 06/20/2017   ALB 4.7 06/20/2017   TOTALPROTEIN 6.9 06/20/2017     RADS: CLINICAL DATA:Postprandial right-sided abdominal pain. Evaluate  for biliary dyskinesia.    EXAM:  NUCLEAR MEDICINE HEPATOBILIARY IMAGING WITH GALLBLADDER EF    TECHNIQUE:  Sequential images of the abdomen were obtained  out to 60 minutes  following intravenous administration of radiopharmaceutical. After  oral ingestion of Ensure, gallbladder ejection fraction was  determined. At 60 min, normal ejection fraction is greater than 33%.    RADIOPHARMACEUTICALS:5.24 mCi Tc-4399mCholetec IV    COMPARISON:CT abdomen pelvis-08/01/2017    FINDINGS:  There is homogeneous distribution of injected radiotracer throughout  the hepatic parenchyma.    There is early excretion of radiotracer with opacification of the  common bile duct, initially seen on the 10 minutes anterior  projection planar image, with passage of contrast into the proximal  small bowel, initially seen on the 15 minute anterior projection  planar image.    There is brisk opacification of the gallbladder initially seen on  the 20 minute anterior projection planar image.    Following the ingestion of a fatty meal, there is appropriate  emptying of the gallbladder with estimated ejection fraction of 46%  (normal gallbladder ejection fraction with Ensure is greater than  33%).    Patient did experience abdominal pain with ingestion of fatty meal.    IMPRESSION:  1. No scintigraphic evidence of acute cholecystitis.  2. Indeterminate findings of biliary dyskinesia with normal  gallbladder ejection fraction though patient did experience  abdominal pain with ingestion of a fatty meal. Clinical correlation  is advised.      Electronically Signed  By: Perrin MalteseJohnWatts M.D.  On: 08/05/2017 13:42    CLINICAL DATA:Abdominal pain and fever.    EXAM:  CT ABDOMEN AND PELVIS WITH CONTRAST    TECHNIQUE:  Multidetector CT imaging of the abdomen and pelvis was performed  using the standard protocol following bolus administration of  intravenous contrast.    CONTRAST:18300mL OMNIPAQUE IOHEXOL 300 MG/MLSOLN    COMPARISON:06/29/2017    FINDINGS:  Lower chest: Atelectasis noted in  the lung bases.    Hepatobiliary: No focal abnormality within the liver parenchyma.  There is no evidence for gallstones, gallbladder wall thickening, or  pericholecystic fluid. No intrahepatic or extrahepatic biliary  dilation.    Pancreas: No focal mass lesion. No dilatation of the main duct. No  intraparenchymal cyst. No peripancreatic edema.    Spleen: No splenomegaly. No focal mass lesion.    Adrenals/Urinary Tract: No adrenal nodule or mass. Tiny  hypoattenuating lesions in the lower pole the right kidney are  stable. No evidence for hydroureter. Bladder is moderately  distended.    Stomach/Bowel: Stomach is nondistended. No gastric wall thickening.  No evidence of outlet obstruction. Duodenum is normally positioned  as is the ligament of Treitz. No small bowel wall thickening. No  small bowel dilatation. The terminal ileum is normal. The appendix  is normal. No gross colonic mass. No colonic wall thickening. No  substantial diverticular change.    Vascular/Lymphatic: No abdominal  aortic aneurysm. No abdominal  aortic atherosclerotic calcification. There is no gastrohepatic or  hepatoduodenal ligament lymphadenopathy. No intraperitoneal or  retroperitoneal lymphadenopathy. No pelvic sidewall lymphadenopathy.    Reproductive: Uterus surgically absent.There is no adnexal mass.    Other: No intraperitoneal free fluid.    Musculoskeletal: No worrisome lytic or sclerotic osseous  abnormality.    IMPRESSION:  1. Unremarkable CT scan of the abdomen and pelvis. No acute findings  to explain the patient's history of right upper quadrant pain  radiating into the lower quadrant.   EXAM:  ULTRASOUND ABDOMEN LIMITED RIGHT UPPER QUADRANT    COMPARISON:06/29/2017 CT.    FINDINGS:  Gallbladder:    No gallstones or wall thickening visualized. No sonographic Murphy  sign noted by sonographer.    Common bile duct:     Diameter: 4.3 mm    Liver:    No focal lesion identified. Within normal limits in parenchymal  echogenicity. Portal vein is patent on color Doppler imaging with  normal direction of blood flow towards the liver.    IMPRESSION:  Negative right upper quadrant ultrasound.      Electronically Signed  By: Novella RobStevenOlson M.D.  On: 08/01/2017 15:56     Assessment:      Mesenteric ischemia, chronic (CMS-HCC) [K55.1]  Plan:   1. Mesenteric ischemia, chronic (CMS-HCC) [K55.1]  Hx and pain out of proportion to otherwise normal physical exam, coupled with reported strong FH of CAD, and CT with evidence of aortic calcifications in a pt with smoking history raises concern for mesenteric ischemia more than gallbladder issues.  I explained in detail to patient the pathophysiology and recommended a dedicate CT angio to see if we can confirm diagnosis.  Since her colonoscopy was negative for ischemia, her small bowel maybe mostly affected if she truly has ischemic issues.  I also explained to her that because she has otherwise normal workup for her gallbladder, lap chole at this point will likely not yield any significant benefit for her.  If CT angio is negative, and her new PPI regimen does not provide any significant relief, I explained to her that we can still potentially proceed with a lap chole, understanding I make no guarantees in improvement of her symptoms.  Pt verbalized understanding and will like to proceed with CTA first.  UPDATE: CTA negative for ischemia.  Pt has no relief in pain she is experiencing and is requesting lap chole.  Discussed the risk of surgery including post-op infxn, seroma, biloma, chronic pain, poor-delayed wound healing, retained gallstone, conversion to open procedure, post-op SBO or ileus, and need for additional procedures to address said risks.  The risks of general anesthetic including MI, CVA, sudden death or even reaction to  anesthetic medications also discussed. Alternatives include continued observation.  Benefits include possible symptom relief, prevention of complications including acute cholecystitis, pancreatitis.  Typical post operative recovery of 3-5 days rest, continued pain in area and incision sites, possible loose stools up to 4-6 weeks, also discussed.  The patient understands the risks and NO GUARANTEE of pain relief after procedure.  Any and all remaining questions were answered to the patient's satisfaction.  She will still like to proceed with surgery

## 2017-08-22 NOTE — H&P (View-Only) (Signed)
CC: Mesenteric ischemia, chronic (CMS-HCC) [K55.1]  HPI:  Linda Walker is a 52 y.o. female who was referred by Michelle Christine John* for evaluation of above CC. Symptoms were first noted 3 months ago. Pain is achy and intermittent, periumbilical region and also along the right side.  Associated with eating and BMs, happens every single time she eats or drinks,  exacerbated by above.  Pain lasts anywhere from 20min to couple hours. She has lost wt to due to not being able to eat.  She has tried different diet and food consistency and no difference in symptoms.  She has diarrhea and constipation.  Extensive workup by GI including serum tests and EGD, colonoscopy has only yielded gastritis.     Past Medical History:  has no past medical history on file.  Past Surgical History:  has a past surgical history that includes Hysterectomy Vaginal; Resection Transurethral Bladder Neck; egd (07/03/2017); and Colonoscopy (07/03/2017).  Family History: family history is not on file.  Social History:  reports that she has been smoking cigarettes.  She has never used smokeless tobacco. She reports that she drank alcohol. Her drug history is not on file.  Current Medications: has a current medication list which includes the following prescription(s): acetaminophen, cephalexin, and omeprazole.  Allergies:       Allergies as of 08/09/2017 - Reviewed 08/09/2017  Allergen Reaction Noted  . Ketek [telithromycin] Vomiting   . Sulfa (sulfonamide antibiotics) Vomiting 06/20/2017    ROS:  A 15 point review of systems was performed and pertinent positives and negatives noted in HPI    Objective:   BP 105/59   Pulse 80   Temp 36.9 C (98.5 F) (Oral)   Ht 165.1 cm (5' 5")   Wt 58.1 kg (128 lb)   BMI 21.30 kg/m    Constitutional :  alert, appears stated age, cooperative and no distress  Lymphatics/Throat:  no asymmetry, masses, or scars  Respiratory:  clear to auscultation  bilaterally  Cardiovascular:  regular rate and rhythm, S1, S2 normal, no murmur, click, rub or gallop  Gastrointestinal: soft, thin, with tenderness in periumbilical region and along entire right side of abdomen, along descending colon and half of transverese colon.  Right groin tenderness noted as well, but no palpable hernia, even in standing position.  the entire left side of the abdomen is non-tender.  no guarding or rebound tenderness..    Musculoskeletal: Steady gait and movement  Skin: Cool and moist  Psychiatric: Normal affect, non-agitated, not confused       LABS:  -      Lab Results  Component Value Date   WBC 9.7 06/20/2017   HGB 15.5 (H) 06/20/2017   HCT 47.5 (H) 06/20/2017   PLT 225 06/20/2017   -      Lab Results  Component Value Date   NA 143 06/20/2017   K 4.3 06/20/2017   CL 106 06/20/2017   CO2 32.2 (H) 06/20/2017   BUN 7 06/20/2017   CREATININE 0.6 06/20/2017   GLUCOSE 103 06/20/2017   -      Lab Results  Component Value Date   NA 143 06/20/2017   K 4.3 06/20/2017   CL 106 06/20/2017   CO2 32.2 (H) 06/20/2017   BUN 7 06/20/2017   CREATININE 0.6 06/20/2017   CALCIUM 9.5 06/20/2017   ALB 4.7 06/20/2017   TBILI 0.6 06/20/2017   ALKPHOS 50 06/20/2017   AST 18 06/20/2017   ALT 14 06/20/2017     GLUCOSE 103 06/20/2017   GFR 104 06/20/2017   -      Lab Results  Component Value Date   CHOLTOTAL 177 07/20/2017   TRIG 80 07/20/2017   HDL 52.7 07/20/2017   LDLCALC 108 07/20/2017   -      Lab Results  Component Value Date   AST 18 06/20/2017   ALT 14 06/20/2017   ALKPHOS 50 06/20/2017   TBILI 0.6 06/20/2017   ALB 4.7 06/20/2017   TOTALPROTEIN 6.9 06/20/2017     RADS: CLINICAL DATA:Postprandial right-sided abdominal pain. Evaluate  for biliary dyskinesia.    EXAM:  NUCLEAR MEDICINE HEPATOBILIARY IMAGING WITH GALLBLADDER EF    TECHNIQUE:  Sequential images of the abdomen were obtained  out to 60 minutes  following intravenous administration of radiopharmaceutical. After  oral ingestion of Ensure, gallbladder ejection fraction was  determined. At 60 min, normal ejection fraction is greater than 33%.    RADIOPHARMACEUTICALS:5.24 mCi Tc-99mCholetec IV    COMPARISON:CT abdomen pelvis-08/01/2017    FINDINGS:  There is homogeneous distribution of injected radiotracer throughout  the hepatic parenchyma.    There is early excretion of radiotracer with opacification of the  common bile duct, initially seen on the 10 minutes anterior  projection planar image, with passage of contrast into the proximal  small bowel, initially seen on the 15 minute anterior projection  planar image.    There is brisk opacification of the gallbladder initially seen on  the 20 minute anterior projection planar image.    Following the ingestion of a fatty meal, there is appropriate  emptying of the gallbladder with estimated ejection fraction of 46%  (normal gallbladder ejection fraction with Ensure is greater than  33%).    Patient did experience abdominal pain with ingestion of fatty meal.    IMPRESSION:  1. No scintigraphic evidence of acute cholecystitis.  2. Indeterminate findings of biliary dyskinesia with normal  gallbladder ejection fraction though patient did experience  abdominal pain with ingestion of a fatty meal. Clinical correlation  is advised.      Electronically Signed  By: JohnWatts M.D.  On: 08/05/2017 13:42    CLINICAL DATA:Abdominal pain and fever.    EXAM:  CT ABDOMEN AND PELVIS WITH CONTRAST    TECHNIQUE:  Multidetector CT imaging of the abdomen and pelvis was performed  using the standard protocol following bolus administration of  intravenous contrast.    CONTRAST:100mL OMNIPAQUE IOHEXOL 300 MG/MLSOLN    COMPARISON:06/29/2017    FINDINGS:  Lower chest: Atelectasis noted in  the lung bases.    Hepatobiliary: No focal abnormality within the liver parenchyma.  There is no evidence for gallstones, gallbladder wall thickening, or  pericholecystic fluid. No intrahepatic or extrahepatic biliary  dilation.    Pancreas: No focal mass lesion. No dilatation of the main duct. No  intraparenchymal cyst. No peripancreatic edema.    Spleen: No splenomegaly. No focal mass lesion.    Adrenals/Urinary Tract: No adrenal nodule or mass. Tiny  hypoattenuating lesions in the lower pole the right kidney are  stable. No evidence for hydroureter. Bladder is moderately  distended.    Stomach/Bowel: Stomach is nondistended. No gastric wall thickening.  No evidence of outlet obstruction. Duodenum is normally positioned  as is the ligament of Treitz. No small bowel wall thickening. No  small bowel dilatation. The terminal ileum is normal. The appendix  is normal. No gross colonic mass. No colonic wall thickening. No  substantial diverticular change.    Vascular/Lymphatic: No abdominal   aortic aneurysm. No abdominal  aortic atherosclerotic calcification. There is no gastrohepatic or  hepatoduodenal ligament lymphadenopathy. No intraperitoneal or  retroperitoneal lymphadenopathy. No pelvic sidewall lymphadenopathy.    Reproductive: Uterus surgically absent.There is no adnexal mass.    Other: No intraperitoneal free fluid.    Musculoskeletal: No worrisome lytic or sclerotic osseous  abnormality.    IMPRESSION:  1. Unremarkable CT scan of the abdomen and pelvis. No acute findings  to explain the patient's history of right upper quadrant pain  radiating into the lower quadrant.   EXAM:  ULTRASOUND ABDOMEN LIMITED RIGHT UPPER QUADRANT    COMPARISON:06/29/2017 CT.    FINDINGS:  Gallbladder:    No gallstones or wall thickening visualized. No sonographic Murphy  sign noted by sonographer.    Common bile duct:     Diameter: 4.3 mm    Liver:    No focal lesion identified. Within normal limits in parenchymal  echogenicity. Portal vein is patent on color Doppler imaging with  normal direction of blood flow towards the liver.    IMPRESSION:  Negative right upper quadrant ultrasound.      Electronically Signed  By: StevenOlson M.D.  On: 08/01/2017 15:56     Assessment:      Mesenteric ischemia, chronic (CMS-HCC) [K55.1]  Plan:   1. Mesenteric ischemia, chronic (CMS-HCC) [K55.1]  Hx and pain out of proportion to otherwise normal physical exam, coupled with reported strong FH of CAD, and CT with evidence of aortic calcifications in a pt with smoking history raises concern for mesenteric ischemia more than gallbladder issues.  I explained in detail to patient the pathophysiology and recommended a dedicate CT angio to see if we can confirm diagnosis.  Since her colonoscopy was negative for ischemia, her small bowel maybe mostly affected if she truly has ischemic issues.  I also explained to her that because she has otherwise normal workup for her gallbladder, lap chole at this point will likely not yield any significant benefit for her.  If CT angio is negative, and her new PPI regimen does not provide any significant relief, I explained to her that we can still potentially proceed with a lap chole, understanding I make no guarantees in improvement of her symptoms.  Pt verbalized understanding and will like to proceed with CTA first.  UPDATE: CTA negative for ischemia.  Pt has no relief in pain she is experiencing and is requesting lap chole.  Discussed the risk of surgery including post-op infxn, seroma, biloma, chronic pain, poor-delayed wound healing, retained gallstone, conversion to open procedure, post-op SBO or ileus, and need for additional procedures to address said risks.  The risks of general anesthetic including MI, CVA, sudden death or even reaction to  anesthetic medications also discussed. Alternatives include continued observation.  Benefits include possible symptom relief, prevention of complications including acute cholecystitis, pancreatitis.  Typical post operative recovery of 3-5 days rest, continued pain in area and incision sites, possible loose stools up to 4-6 weeks, also discussed.  The patient understands the risks and NO GUARANTEE of pain relief after procedure.  Any and all remaining questions were answered to the patient's satisfaction.  She will still like to proceed with surgery     

## 2017-08-22 NOTE — Patient Instructions (Signed)
Your procedure is scheduled on: 08-29-17  Report to Same Day Surgery 2nd floor medical mall [facility](Medical Mall Entrance-take elevator on left to 2nd floor.  Check in with surgery information desk.) To find out your arrival time please call [phone] between 1PM - 3PM on  08-28-17  Remember: Instructions that are not followed completely may result in serious medical risk, up to and including death, or upon the discretion of your surgeon and anesthesiologist your surgery may need to be rescheduled.    _x___ 1. Do not eat food after midnight the night before your procedure. NO GUM OR CANDY AFTER MIDNIGHT.  You may drink clear liquids up to 2 hours before you are scheduled to arrive at the hospital for your procedure.  Do not drink clear liquids within 2 hours of your scheduled arrival to the hospital.  Clear liquids include  --Water or Apple juice without pulp  --Clear carbohydrate beverage such as ClearFast or Gatorade  --Black Coffee or Clear Tea (No milk, no creamers, do not add anything to the coffee or Tea   ____Ensure clear carbohydrate drink on the way to the hospital for bariatric patients  ____Ensure clear carbohydrate drink 3 hours before surgery for Dr Rutherford NailByrnett's patients if physician instructed.     __x__ 2. No Alcohol for 24 hours before or after surgery.   __x__3. No Smoking or e-cigarettes for 24 prior to surgery.  Do not use any chewable tobacco products for at least 6 hour prior to surgery   ____  4. Bring all medications with you on the day of surgery if instructed.    __x__ 5. Notify your doctor if there is any change in your medical condition     (cold, fever, infections).    x___6. On the morning of surgery brush your teeth with toothpaste and water.  You may rinse your mouth with mouth wash if you wish.  Do not swallow any toothpaste or mouthwash.   Do not wear jewelry, make-up, hairpins, clips or nail polish.  Do not wear lotions, powders, or perfumes. You may wear  deodorant.  Do not shave 48 hours prior to surgery. Men may shave face and neck.  Do not bring valuables to the hospital.    [facility] is not responsible for any belongings or valuables.               Contacts, dentures or bridgework may not be worn into surgery.  Leave your suitcase in the car. After surgery it may be brought to your room.  For patients admitted to the hospital, discharge time is determined by your treatment team.  _  Patients discharged the day of surgery will not be allowed to drive home.  You will need someone to drive you home and stay with you the night of your procedure.    Please read over the following fact sheets that you were given:   [facility] Preparing for [facility]  _x___ TAKE THE FOLLOWING MEDICATION THE MORNING OF SURGERY WITH A SMALL SIP OF WATER. These include:  1. OMEPRAZOLE  2. TAKE AN OMEPRAZOLE THE NIGHT BEFORE YOUR SURGERY  3. YOU MAY TAKE YOUR PERCOCET DAY OF SURGERY IF NEEDED  4.  5.  6.  ____Fleets enema or Magnesium Citrate as directed.   ____ Use CHG Soap or sage wipes as directed on instruction sheet   ____ Use inhalers on the day of surgery and bring to hospital day of surgery  ____ Stop Metformin and Janumet 2  days prior to surgery.    ____ Take 1/2 of usual insulin dose the night before surgery and none on the morning surgery.   ____ Follow recommendations from Cardiologist, Pulmonologist or PCP regarding stopping Aspirin, Coumadin, Plavix ,Eliquis, Effient, or Pradaxa, and Pletal.  X____Stop Anti-inflammatories such as Advil, Aleve, Ibuprofen, Motrin, Naproxen, Naprosyn, Goodies powders or aspirin products NOW- OK to take Tylenol    ____ Stop supplements until after surgery.   ____ Bring C-Pap to the hospital.

## 2017-08-28 MED ORDER — CEFAZOLIN SODIUM-DEXTROSE 2-4 GM/100ML-% IV SOLN
2.0000 g | INTRAVENOUS | Status: AC
  Administered 2017-08-29: 2 g via INTRAVENOUS

## 2017-08-29 ENCOUNTER — Ambulatory Visit
Admission: RE | Admit: 2017-08-29 | Discharge: 2017-08-29 | Disposition: A | Discharge status: Home / Self Care | Payer: BC Managed Care – PPO | Source: Ambulatory Visit | Attending: Surgery | Admitting: Surgery

## 2017-08-29 ENCOUNTER — Ambulatory Visit: Payer: BC Managed Care – PPO

## 2017-08-29 ENCOUNTER — Encounter
Admission: RE | Disposition: A | Discharge status: Home / Self Care | Payer: Self-pay | Source: Ambulatory Visit | Attending: Surgery

## 2017-08-29 ENCOUNTER — Encounter: Payer: Self-pay | Admitting: *Deleted

## 2017-08-29 ENCOUNTER — Other Ambulatory Visit: Payer: Self-pay

## 2017-08-29 DIAGNOSIS — K660 Peritoneal adhesions (postprocedural) (postinfection): Secondary | ICD-10-CM | POA: Insufficient documentation

## 2017-08-29 DIAGNOSIS — K811 Chronic cholecystitis: Secondary | ICD-10-CM | POA: Insufficient documentation

## 2017-08-29 DIAGNOSIS — F17210 Nicotine dependence, cigarettes, uncomplicated: Secondary | ICD-10-CM | POA: Insufficient documentation

## 2017-08-29 DIAGNOSIS — K810 Acute cholecystitis: Secondary | ICD-10-CM

## 2017-08-29 DIAGNOSIS — R1011 Right upper quadrant pain: Secondary | ICD-10-CM | POA: Diagnosis present

## 2017-08-29 HISTORY — PX: CHOLECYSTECTOMY: SHX55

## 2017-08-29 SURGERY — LAPAROSCOPIC CHOLECYSTECTOMY
Anesthesia: General | Wound class: Clean Contaminated

## 2017-08-29 MED ORDER — IBUPROFEN 800 MG PO TABS: 800.0000 mg | ORAL_TABLET | Freq: Three times a day (TID) | ORAL | 0 refills | Status: AC | PRN

## 2017-08-29 MED ORDER — HYDROMORPHONE HCL 1 MG/ML IJ SOLN
INTRAMUSCULAR | Status: AC
  Filled 2017-08-29: qty 1

## 2017-08-29 MED ORDER — CHLORHEXIDINE GLUCONATE CLOTH 2 % EX PADS: 6.0000 | MEDICATED_PAD | Freq: Once | CUTANEOUS | Status: DC

## 2017-08-29 MED ORDER — PROPOFOL 10 MG/ML IV BOLUS
INTRAVENOUS | Status: AC
  Filled 2017-08-29: qty 20

## 2017-08-29 MED ORDER — DEXMEDETOMIDINE HCL IN NACL 80 MCG/20ML IV SOLN
INTRAVENOUS | Status: AC
  Filled 2017-08-29: qty 20

## 2017-08-29 MED ORDER — FENTANYL CITRATE (PF) 100 MCG/2ML IJ SOLN
INTRAMUSCULAR | Status: AC
  Filled 2017-08-29: qty 2

## 2017-08-29 MED ORDER — MIDAZOLAM HCL 2 MG/2ML IJ SOLN
INTRAMUSCULAR | Status: DC | PRN
  Administered 2017-08-29: 2 mg via INTRAVENOUS

## 2017-08-29 MED ORDER — BUPIVACAINE HCL (PF) 0.5 % IJ SOLN
INTRAMUSCULAR | Status: AC
  Filled 2017-08-29: qty 30

## 2017-08-29 MED ORDER — FENTANYL CITRATE (PF) 100 MCG/2ML IJ SOLN
INTRAMUSCULAR | Status: DC | PRN
  Administered 2017-08-29 (×2): 50 ug via INTRAVENOUS

## 2017-08-29 MED ORDER — SODIUM CHLORIDE 0.9 % IV SOLN
INTRAVENOUS | Status: DC | PRN
  Administered 2017-08-29: 30 ug/min via INTRAVENOUS

## 2017-08-29 MED ORDER — LIDOCAINE-EPINEPHRINE (PF) 1 %-1:200000 IJ SOLN
INTRAMUSCULAR | Status: AC
  Filled 2017-08-29: qty 30

## 2017-08-29 MED ORDER — ROCURONIUM BROMIDE 50 MG/5ML IV SOLN
INTRAVENOUS | Status: AC
  Filled 2017-08-29: qty 1

## 2017-08-29 MED ORDER — LIDOCAINE HCL (PF) 2 % IJ SOLN
INTRAMUSCULAR | Status: AC
  Filled 2017-08-29: qty 10

## 2017-08-29 MED ORDER — GLYCOPYRROLATE 0.2 MG/ML IJ SOLN
INTRAMUSCULAR | Status: DC | PRN
  Administered 2017-08-29: 0.2 mg via INTRAVENOUS

## 2017-08-29 MED ORDER — SUGAMMADEX SODIUM 200 MG/2ML IV SOLN
INTRAVENOUS | Status: DC | PRN
  Administered 2017-08-29: 127 mg via INTRAVENOUS

## 2017-08-29 MED ORDER — ROCURONIUM BROMIDE 100 MG/10ML IV SOLN
INTRAVENOUS | Status: DC | PRN
  Administered 2017-08-29: 40 mg via INTRAVENOUS
  Administered 2017-08-29 (×2): 5 mg via INTRAVENOUS

## 2017-08-29 MED ORDER — ACETAMINOPHEN 10 MG/ML IV SOLN
INTRAVENOUS | Status: DC | PRN
  Administered 2017-08-29: 1000 mg via INTRAVENOUS

## 2017-08-29 MED ORDER — MIDAZOLAM HCL 2 MG/2ML IJ SOLN
INTRAMUSCULAR | Status: AC
  Filled 2017-08-29: qty 2

## 2017-08-29 MED ORDER — DEXAMETHASONE SODIUM PHOSPHATE 10 MG/ML IJ SOLN
INTRAMUSCULAR | Status: DC | PRN
  Administered 2017-08-29: 10 mg via INTRAVENOUS

## 2017-08-29 MED ORDER — DEXMEDETOMIDINE HCL IN NACL 200 MCG/50ML IV SOLN
INTRAVENOUS | Status: DC | PRN
  Administered 2017-08-29: 8 ug via INTRAVENOUS
  Administered 2017-08-29: 4 ug via INTRAVENOUS
  Administered 2017-08-29: 8 ug via INTRAVENOUS

## 2017-08-29 MED ORDER — ONDANSETRON HCL 4 MG/2ML IJ SOLN
INTRAMUSCULAR | Status: AC
  Filled 2017-08-29: qty 2

## 2017-08-29 MED ORDER — SUGAMMADEX SODIUM 200 MG/2ML IV SOLN
INTRAVENOUS | Status: AC
  Filled 2017-08-29: qty 2

## 2017-08-29 MED ORDER — DEXAMETHASONE SODIUM PHOSPHATE 10 MG/ML IJ SOLN
INTRAMUSCULAR | Status: AC
  Filled 2017-08-29: qty 1

## 2017-08-29 MED ORDER — ONDANSETRON HCL 4 MG/2ML IJ SOLN: 4.0000 mg | Freq: Once | INTRAMUSCULAR | Status: DC | PRN

## 2017-08-29 MED ORDER — ONDANSETRON HCL 4 MG/2ML IJ SOLN
INTRAMUSCULAR | Status: DC | PRN
  Administered 2017-08-29: 4 mg via INTRAVENOUS

## 2017-08-29 MED ORDER — IBUPROFEN 800 MG PO TABS
ORAL_TABLET | ORAL | Status: AC
  Administered 2017-08-29: 11:00:00 via ORAL
  Filled 2017-08-29: qty 1

## 2017-08-29 MED ORDER — HYDROCODONE-ACETAMINOPHEN 5-325 MG PO TABS: 1.0000 | ORAL_TABLET | Freq: Four times a day (QID) | ORAL | 0 refills | Status: AC | PRN

## 2017-08-29 MED ORDER — IBUPROFEN 800 MG PO TABS
800.0000 mg | ORAL_TABLET | Freq: Three times a day (TID) | ORAL | Status: DC | PRN
  Filled 2017-08-29: qty 1

## 2017-08-29 MED ORDER — LIDOCAINE-EPINEPHRINE (PF) 1 %-1:200000 IJ SOLN
INTRAMUSCULAR | Status: DC | PRN
  Administered 2017-08-29: 21 mL

## 2017-08-29 MED ORDER — ACETAMINOPHEN 10 MG/ML IV SOLN
INTRAVENOUS | Status: AC
  Filled 2017-08-29: qty 100

## 2017-08-29 MED ORDER — HYDROMORPHONE HCL 1 MG/ML IJ SOLN
INTRAMUSCULAR | Status: DC | PRN
  Administered 2017-08-29 (×3): .2 mg via INTRAVENOUS

## 2017-08-29 MED ORDER — LACTATED RINGERS IV SOLN
INTRAVENOUS | Status: DC
  Administered 2017-08-29 (×2): via INTRAVENOUS

## 2017-08-29 MED ORDER — PROPOFOL 10 MG/ML IV BOLUS
INTRAVENOUS | Status: DC | PRN
  Administered 2017-08-29: 120 mg via INTRAVENOUS
  Administered 2017-08-29: 30 mg via INTRAVENOUS

## 2017-08-29 MED ORDER — FENTANYL CITRATE (PF) 100 MCG/2ML IJ SOLN
25.0000 ug | INTRAMUSCULAR | Status: DC | PRN
  Administered 2017-08-29: 25 ug via INTRAVENOUS

## 2017-08-29 MED ORDER — LIDOCAINE HCL (CARDIAC) PF 100 MG/5ML IV SOSY
PREFILLED_SYRINGE | INTRAVENOUS | Status: DC | PRN
  Administered 2017-08-29: 40 mg via INTRAVENOUS

## 2017-08-29 SURGICAL SUPPLY — 57 items
APPLICATOR COTTON TIP 6 STRL (MISCELLANEOUS) IMPLANT
APPLICATOR COTTON TIP 6IN STRL (MISCELLANEOUS)
APPLIER CLIP 5 13 M/L LIGAMAX5 (MISCELLANEOUS) ×3
BLADE SURG 11 STRL SS SAFETY (MISCELLANEOUS) ×3 IMPLANT
CANISTER SUCT 1200ML W/VALVE (MISCELLANEOUS) ×3 IMPLANT
CHLORAPREP W/TINT 26ML (MISCELLANEOUS) ×3 IMPLANT
CHOLANGIOGRAM CATH TAUT (CATHETERS) IMPLANT
CLIP APPLIE 5 13 M/L LIGAMAX5 (MISCELLANEOUS) ×1 IMPLANT
DECANTER SPIKE VIAL GLASS SM (MISCELLANEOUS) ×6 IMPLANT
DEFOGGER SCOPE WARMER CLEARIFY (MISCELLANEOUS) IMPLANT
DERMABOND ADVANCED (GAUZE/BANDAGES/DRESSINGS) ×2
DERMABOND ADVANCED .7 DNX12 (GAUZE/BANDAGES/DRESSINGS) ×1 IMPLANT
DISSECTOR BLUNT TIP ENDO 5MM (MISCELLANEOUS) IMPLANT
DISSECTOR KITTNER STICK (MISCELLANEOUS) ×1 IMPLANT
DISSECTORS/KITTNER STICK (MISCELLANEOUS) ×3
DRAPE C-ARM XRAY 36X54 (DRAPES) IMPLANT
DRAPE SHEET LG 3/4 BI-LAMINATE (DRAPES) IMPLANT
ELECT CAUTERY BLADE 6.4 (BLADE) IMPLANT
ELECT REM PT RETURN 9FT ADLT (ELECTROSURGICAL) ×3
ELECTRODE REM PT RTRN 9FT ADLT (ELECTROSURGICAL) ×1 IMPLANT
ENDOLOOP SUT PDS II  0 18 (SUTURE) ×2
ENDOLOOP SUT PDS II 0 18 (SUTURE) ×1 IMPLANT
GLOVE BIOGEL PI IND STRL 7.0 (GLOVE) ×1 IMPLANT
GLOVE BIOGEL PI INDICATOR 7.0 (GLOVE) ×2
GLOVE SURG SYN 6.5 ES PF (GLOVE) ×3 IMPLANT
GOWN STRL REUS W/ TWL LRG LVL3 (GOWN DISPOSABLE) ×3 IMPLANT
GOWN STRL REUS W/TWL LRG LVL3 (GOWN DISPOSABLE) ×6
GRASPER SUT TROCAR 14GX15 (MISCELLANEOUS) ×3 IMPLANT
IRRIGATION STRYKERFLOW (MISCELLANEOUS) IMPLANT
IRRIGATOR STRYKERFLOW (MISCELLANEOUS)
IV CATH ANGIO 12GX3 LT BLUE (NEEDLE) IMPLANT
IV NS 1000ML (IV SOLUTION) ×2
IV NS 1000ML BAXH (IV SOLUTION) ×1 IMPLANT
JACKSON PRATT 10 (INSTRUMENTS) IMPLANT
L-HOOK LAP DISP 36CM (ELECTROSURGICAL) ×3
LHOOK LAP DISP 36CM (ELECTROSURGICAL) ×1 IMPLANT
NEEDLE HYPO 22GX1.5 SAFETY (NEEDLE) ×3 IMPLANT
PACK LAP CHOLECYSTECTOMY (MISCELLANEOUS) ×3 IMPLANT
PENCIL ELECTRO HAND CTR (MISCELLANEOUS) ×3 IMPLANT
PORT ACCESS TROCAR AIRSEAL 5 (TROCAR) ×3 IMPLANT
POUCH SPECIMEN RETRIEVAL 10MM (ENDOMECHANICALS) ×3 IMPLANT
SCISSORS METZENBAUM CVD 33 (INSTRUMENTS) ×3 IMPLANT
SET TRI-LUMEN FLTR TB AIRSEAL (TUBING) ×3 IMPLANT
SLEEVE ENDOPATH XCEL 5M (ENDOMECHANICALS) ×3 IMPLANT
SPONGE LAP 18X18 RF (DISPOSABLE) IMPLANT
STOPCOCK 4 WAY LG BORE MALE ST (IV SETS) IMPLANT
SUT MNCRL 4-0 (SUTURE) ×2
SUT MNCRL 4-0 27XMFL (SUTURE) ×1
SUT VIC AB 3-0 SH 27 (SUTURE)
SUT VIC AB 3-0 SH 27X BRD (SUTURE) IMPLANT
SUT VICRYL 0 AB UR-6 (SUTURE) ×6 IMPLANT
SUTURE MNCRL 4-0 27XMF (SUTURE) ×1 IMPLANT
SYR 20CC LL (SYRINGE) ×3 IMPLANT
TOWEL OR 17X26 4PK STRL BLUE (TOWEL DISPOSABLE) ×3 IMPLANT
TROCAR XCEL BLUNT TIP 100MML (ENDOMECHANICALS) ×3 IMPLANT
TROCAR XCEL NON-BLD 5MMX100MML (ENDOMECHANICALS) ×3 IMPLANT
WATER STERILE IRR 1000ML POUR (IV SOLUTION) ×3 IMPLANT

## 2017-08-29 NOTE — Discharge Instructions (Signed)
  AMBULATORY SURGERY  DISCHARGE INSTRUCTIONS   1) The drugs that you were given will stay in your system until tomorrow so for the next 24 hours you should not:  A) Drive an automobile B) Make any legal decisions C) Drink any alcoholic beverage   2) You may resume regular meals tomorrow.  Today it is better to start with liquids and gradually work up to solid foods.  You may eat anything you prefer, but it is better to start with liquids, then soup and crackers, and gradually work up to solid foods.   3) Please notify your doctor immediately if you have any unusual bleeding, trouble breathing, redness and pain at the surgery site, drainage, fever, or pain not relieved by medication.    4) Additional Instructions: TAKE A STOOL SOFTENER TWICE A DAY WHILE TAKING NARCOTIC PAIN MEDICINE TO PREVENT CONSTIPATION   Please contact your physician with any problems or Same Day Surgery at [phone], Monday through Friday 6 am to 4 pm, or [facility] [place] Main number at [phone].   

## 2017-08-29 NOTE — Op Note (Signed)
Preoperative diagnosis:  biliary colic  Postoperative diagnosis: chronic cholecystitis  Procedure: Laparoscopic Cholecystectomy.   Anesthesia: GETA   Surgeon: Sung AmabileIsami Tehila Sokolow  Specimen: Gallbladder  Complications: None  EBL: 5mL  Wound Classification: Clean Contaminated  Indications: see HPI  Findings: Critical view of safety noted Cystic duct and artery identified, ligated and divided, clips remained intact at end of procedure Adequate hemostasis  Description of procedure: The patient was placed on the operating table in the supine position. SCDs placed, pre-op abx administered.  General anesthesia was induced and OG tube placed by anesthesia. A time-out was completed verifying correct patient, procedure, site, positioning, and implant(s) and/or special equipment prior to beginning this procedure. The abdomen was prepped and draped in the usual sterile fashion.  An incision was made in a natural skin line under the umbilicus.  Dissection carried down to fascia where two 0 vicryl sutures placed to use as anchor sutures for hasson port.  Incision made into fascia and blunt dissection used to enter peritoneum.  Hasson port placed and insufflation started up to 15mm Hg without any dramatic increase in pressure.    The laparoscope was inserted and the abdomen inspected. No injuries from initial trocar placement were noted. Additional trocars were then inserted under direct visualization in the following locations: a 5-mm trocar in the subxyphoid region and two 5-mm trocars along the right costal margin. The abdomen was inspected and no abnormalities or injuries were found. The table was placed in the reverse Trendelenburg position with the right side up.  Filmy adhesions between the gallbladder and omentum, duodenum and transverse colon were lysed sharply. The dome of the gallbladder was grasped with an atraumatic grasper passed through the lateral port and retracted over the dome of the liver.  The infundibulum was also grasped with an atraumatic grasper and retracted toward the right lower quadrant. This maneuver exposed Calot's triangle. A large vein within the adhesed peritoneum was isolated, ligated, and clipped, prior to taking down the rest of the peritoneal attachment.  The peritoneum overlying the gallbladder infundibulum was then dissected and the cystic duct and cystic artery identified.  Critical view of safety with the liver bed clearly visible behind the duct and artery with no additional structures noted.  Picture taken before the cystic duct and cystic artery clipped and divided close to the gallbladder.  The cystic stump was somewhat large in caliber so a endoloop was placed around the stump in addition to the clips for extra security.  The gallbladder was then dissected from its peritoneal and liver bed attachments by electrocautery. Hemostasis was checked and the gallbladder were removed using an endoscopic retrieval bag placed through the umbilical port. The gallbladder was passed off the table as a specimen. The gallbladder fossa was copiously irrigated with saline and any leaked bile was suctioned out, and hemostasis was obtained. There was no evidence of bleeding from the gallbladder fossa or cystic artery or leakage of the bile from the cystic duct stump. Abdomen desufflated and secondary trocars were removed under direct vision. No bleeding was noted. The laparoscope was withdrawn and the umbilical trocar removed.  The fascia of the Hasson trocar site was closed with figure-of-eight 0 vicryl sutures.  All skin incisions  were closed with subcuticular sutures of 4-0 monocryl and dressed with topical skin adhesive. The orogastric tube was removed and patient extubated. The patient tolerated the procedure well and was taken to the postanesthesia care unit in stable condition.  All sponge and instrument count correct  at end of procedure.

## 2017-08-29 NOTE — Anesthesia Procedure Notes (Signed)
Procedure Name: Intubation Date/Time: 08/29/2017 7:38 AM Performed by: Alvin Critchley, MD Pre-anesthesia Checklist: Patient identified, Emergency Drugs available, Suction available, Patient being monitored and Timeout performed Patient Re-evaluated:Patient Re-evaluated prior to induction Oxygen Delivery Method: Circle system utilized Preoxygenation: Pre-oxygenation with 100% oxygen Induction Type: IV induction Ventilation: Mask ventilation without difficulty Laryngoscope Size: Mac and 3 Grade View: Grade I Tube type: Oral Tube size: 7.0 mm Number of attempts: 1 Airway Equipment and Method: Stylet Placement Confirmation: ETT inserted through vocal cords under direct vision,  positive ETCO2 and breath sounds checked- equal and bilateral Secured at: 21 cm Tube secured with: Tape Dental Injury: Teeth and Oropharynx as per pre-operative assessment

## 2017-08-29 NOTE — Interval H&P Note (Signed)
History and Physical Interval Note:  08/29/2017 7:13 AM  Linda ElliotStephanie R Walker  has presented today for surgery, with the diagnosis of RIGHT UPPER QUADRANT PAIN  The various methods of treatment have been discussed with the patient and family. After consideration of risks, benefits and other options for treatment, the patient has consented to  Procedure(s): LAPAROSCOPIC CHOLECYSTECTOMY (N/A) as a surgical intervention .  The patient's history has been reviewed, patient examined, no change in status, stable for surgery.  I have reviewed the patient's chart and labs.  Questions were answered to the patient's satisfaction.     Saphia Vanderford Tonna BoehringerSakai

## 2017-08-29 NOTE — Anesthesia Post-op Follow-up Note (Signed)
Anesthesia QCDR form completed.        

## 2017-08-29 NOTE — Transfer of Care (Signed)
Immediate Anesthesia Transfer of Care Note  Patient: Linda ElliotStephanie R Walker  Procedure(s) Performed: LAPAROSCOPIC CHOLECYSTECTOMY (N/A )  Patient Location: PACU  Anesthesia Type:General  Level of Consciousness: drowsy  Airway & Oxygen Therapy: Patient Spontanous Breathing and Patient connected to face mask oxygen  Post-op Assessment: Report given to RN and Post -op Vital signs reviewed and stable  Post vital signs: Reviewed and stable  Last Vitals:  Vitals Value Taken Time  BP 108/58 08/29/2017  9:29 AM  Temp    Pulse 59 08/29/2017  9:32 AM  Resp 12 08/29/2017  9:32 AM  SpO2 100 % 08/29/2017  9:32 AM  Vitals shown include unvalidated device data.  Last Pain:  Vitals:   08/29/17 0618  TempSrc: Temporal  PainSc: 7       Patients Stated Pain Goal: 2 (08/29/17 0618)  Complications: No apparent anesthesia complications

## 2017-08-29 NOTE — Anesthesia Preprocedure Evaluation (Signed)
Anesthesia Evaluation  Patient identified by MRN, date of birth, ID band Patient awake    History of Anesthesia Complications (+) PROLONGED EMERGENCE and history of anesthetic complications  Airway Mallampati: II  TM Distance: >3 FB     Dental   Pulmonary Current Smoker,    Pulmonary exam normal        Cardiovascular negative cardio ROS Normal cardiovascular exam     Neuro/Psych  Headaches, Seizures -,  TIAnegative psych ROS   GI/Hepatic Neg liver ROS, GERD  ,  Endo/Other  negative endocrine ROS  Renal/GU negative Renal ROS  negative genitourinary   Musculoskeletal negative musculoskeletal ROS (+)   Abdominal Normal abdominal exam  (+)   Peds negative pediatric ROS (+)  Hematology negative hematology ROS (+)   Anesthesia Other Findings 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  Reproductive/Obstetrics                             Anesthesia Physical Anesthesia Plan  ASA: III  Anesthesia Plan: General   Post-op Pain Management:    Induction: Intravenous  PONV Risk Score and Plan:   Airway Management Planned: Oral ETT  Additional Equipment:   Intra-op Plan:   Post-operative Plan: Extubation in OR  Informed Consent: I have reviewed the patients History and Physical, chart, labs and discussed the procedure including the risks, benefits and alternatives for the proposed anesthesia with the patient or authorized representative who has indicated his/her understanding and acceptance.   Dental advisory given  Plan Discussed with: CRNA and Surgeon  Anesthesia Plan Comments:         Anesthesia Quick Evaluation

## 2017-08-30 LAB — SURGICAL PATHOLOGY

## 2017-08-30 NOTE — Anesthesia Postprocedure Evaluation (Signed)
Anesthesia Post Note  Patient: Linda ElliotStephanie R Shimko  Procedure(s) Performed: LAPAROSCOPIC CHOLECYSTECTOMY (N/A )  Patient location during evaluation: PACU Anesthesia Type: General Level of consciousness: awake and alert and oriented Pain management: pain level controlled Vital Signs Assessment: post-procedure vital signs reviewed and stable Respiratory status: spontaneous breathing Cardiovascular status: blood pressure returned to baseline Anesthetic complications: no     Last Vitals:  Vitals:   08/29/17 1120 08/29/17 1136  BP: 119/72 110/69  Pulse: 74 68  Resp: 12 12  Temp:    SpO2: 100% 100%    Last Pain:  Vitals:   08/30/17 0924  TempSrc:   PainSc: 8                  Walker Simoneaux

## 2017-12-19 ENCOUNTER — Other Ambulatory Visit: Payer: Self-pay | Admitting: Surgery

## 2017-12-19 DIAGNOSIS — R1010 Upper abdominal pain, unspecified: Secondary | ICD-10-CM

## 2017-12-25 ENCOUNTER — Ambulatory Visit
Admission: RE | Admit: 2017-12-25 | Discharge: 2017-12-25 | Disposition: A | Discharge status: Home / Self Care | Payer: BC Managed Care – PPO | Source: Ambulatory Visit | Attending: Surgery | Admitting: Surgery

## 2017-12-25 DIAGNOSIS — R1010 Upper abdominal pain, unspecified: Secondary | ICD-10-CM | POA: Diagnosis present

## 2017-12-25 MED ORDER — IOPAMIDOL (ISOVUE-300) INJECTION 61%
75.0000 mL | Freq: Once | INTRAVENOUS | Status: AC | PRN
  Administered 2017-12-25: 85 mL via INTRAVENOUS

## 2017-12-28 ENCOUNTER — Ambulatory Visit: Payer: BC Managed Care – PPO

## 2018-12-07 ENCOUNTER — Other Ambulatory Visit: Payer: Self-pay

## 2018-12-07 DIAGNOSIS — Z20822 Contact with and (suspected) exposure to covid-19: Secondary | ICD-10-CM

## 2018-12-08 LAB — NOVEL CORONAVIRUS, NAA: SARS-CoV-2, NAA: NOT DETECTED

## 2019-03-02 ENCOUNTER — Ambulatory Visit: Payer: BC Managed Care – PPO | Attending: Internal Medicine

## 2019-03-02 ENCOUNTER — Other Ambulatory Visit: Payer: Self-pay

## 2019-03-02 DIAGNOSIS — Z23 Encounter for immunization: Secondary | ICD-10-CM

## 2019-03-02 NOTE — Progress Notes (Signed)
   Covid-19 Vaccination Clinic  Name:  Linda Walker    MRN: [id number] DOB: 27-Sep-1965  03/02/2019  Linda Walker was observed post Covid-19 immunization for 15 minutes without incidence. She was provided with Vaccine Information Sheet and instruction to access the V-Safe system.   Linda Walker was instructed to call 911 with any severe reactions post vaccine: Marland Kitchen Difficulty breathing  . Swelling of your face and throat  . A fast heartbeat  . A bad rash all over your body  . Dizziness and weakness    Immunizations Administered    Name Date Dose VIS Date Route   Moderna COVID-19 Vaccine 03/02/2019 12:28 PM 0.5 mL 12/04/2018 Intramuscular   Manufacturer: Moderna   Lot: [id number]   NDC: 28208-138-87

## 2019-03-30 ENCOUNTER — Ambulatory Visit: Payer: BC Managed Care – PPO | Attending: Internal Medicine

## 2019-03-30 DIAGNOSIS — Z23 Encounter for immunization: Secondary | ICD-10-CM

## 2019-03-30 NOTE — Progress Notes (Signed)
   Covid-19 Vaccination Clinic  Name:  Linda Walker    MRN: [id number] DOB: 07-21-1965  03/30/2019  [name] was observed post Covid-19 immunization for 15 minutes without incident. She was provided with Vaccine Information Sheet and instruction to access the V-Safe system.   [name] was instructed to call 911 with any severe reactions post vaccine: Marland Kitchen Difficulty breathing  . Swelling of face and throat  . A fast heartbeat  . A bad rash all over body  . Dizziness and weakness   Immunizations Administered    Name Date Dose VIS Date Route   Moderna COVID-19 Vaccine 03/30/2019 12:13 PM 0.5 mL 12/04/2018 Intramuscular   Manufacturer: Gala Murdoch   Lot: [id number]   NDC: 94503-888-28

## 2019-12-08 IMAGING — US US ABDOMEN LIMITED
1 series · 14 of 25 positions shown · non-contrast
Comparison: 06/29/2017 CT.

CLINICAL DATA: 52-year-old female with right upper quadrant pain
and nausea for the past day. Initial encounter.

EXAM:
ULTRASOUND ABDOMEN LIMITED RIGHT UPPER QUADRANT

[Series 1: us abdomen limited · 0.17mm/px · 14 of 47 slices shown]
[im 1/47]
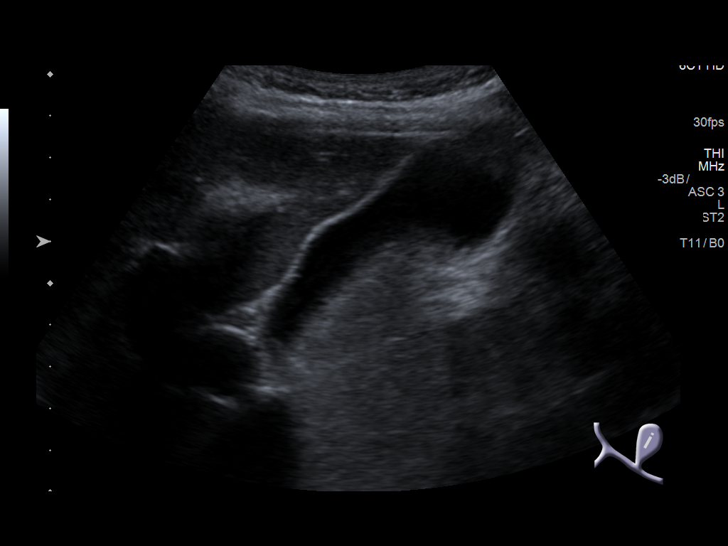
[im 4/47]
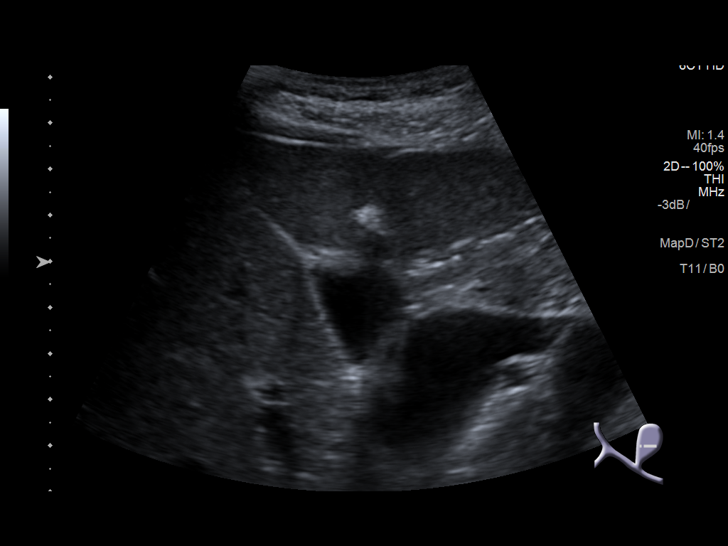
[im 8/47]
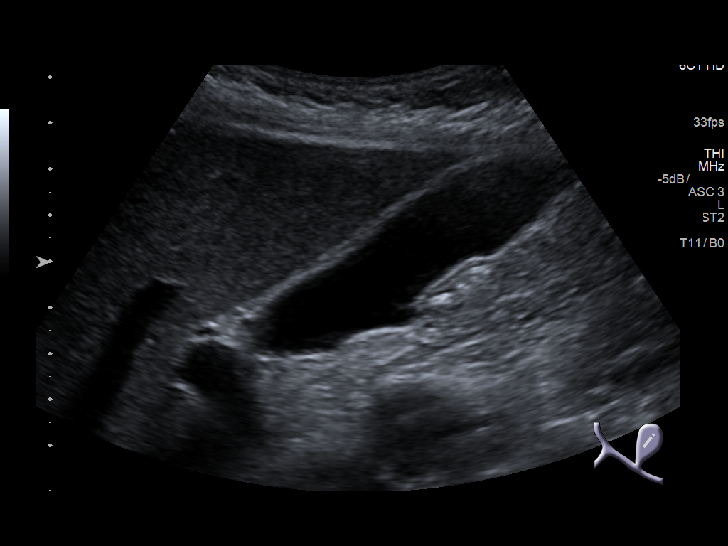
[im 12/47]
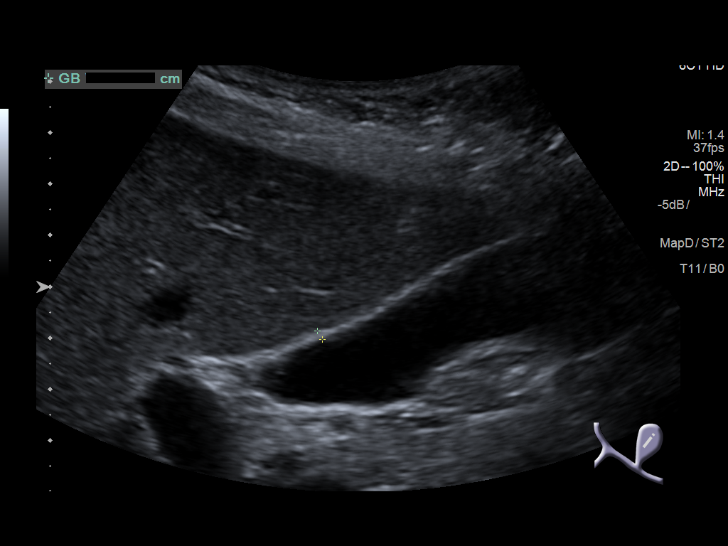
[im 16/47]
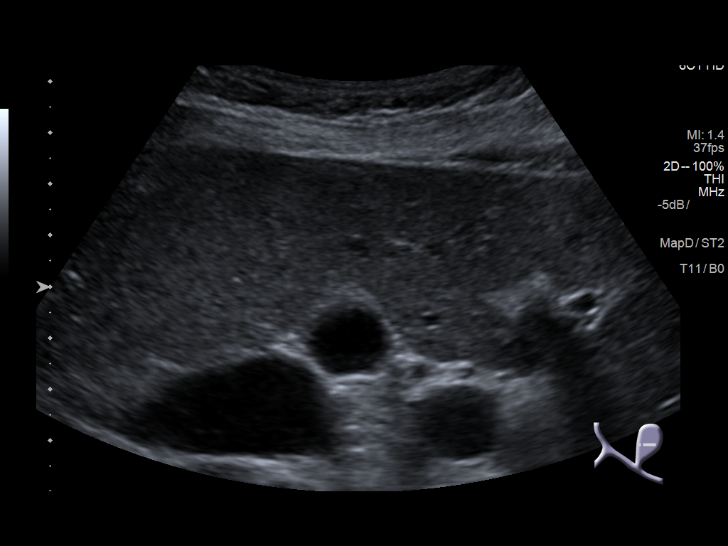
[im 18/47]
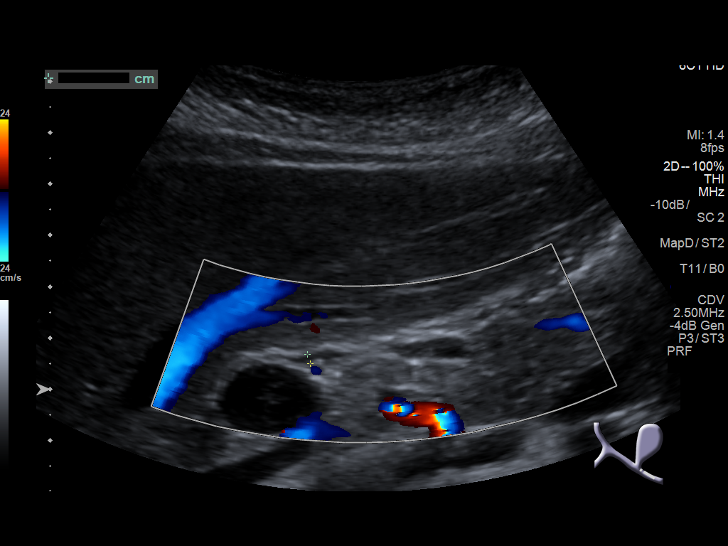
[im 22/47]
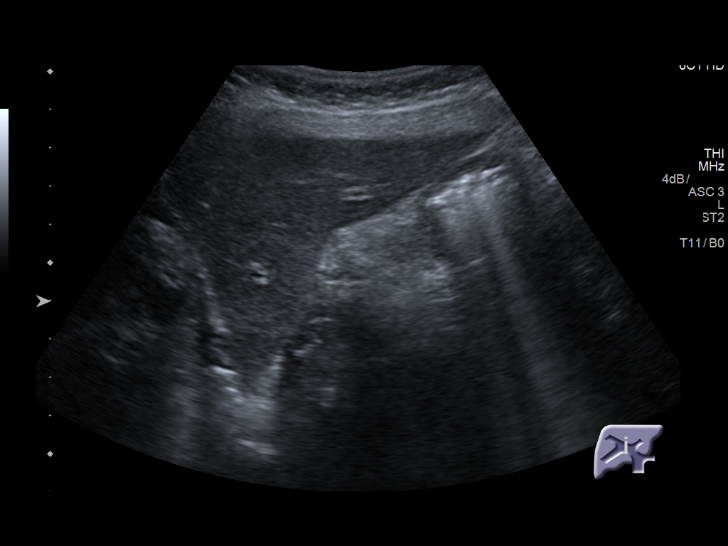
[im 25/47]
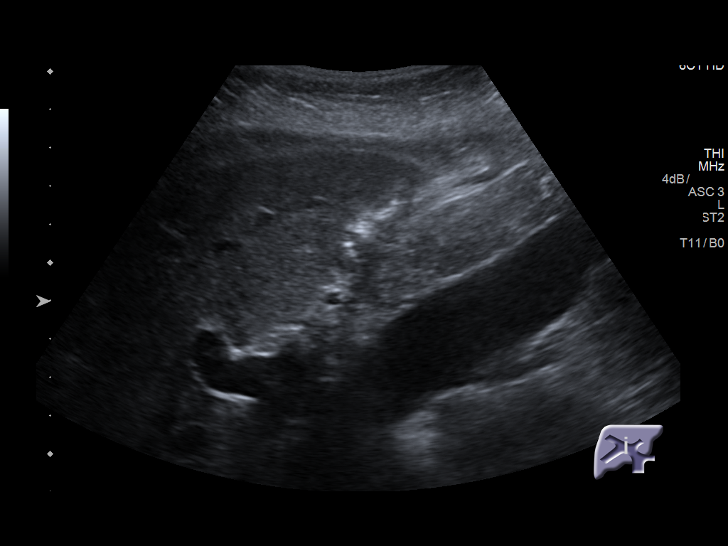
[im 29/47]
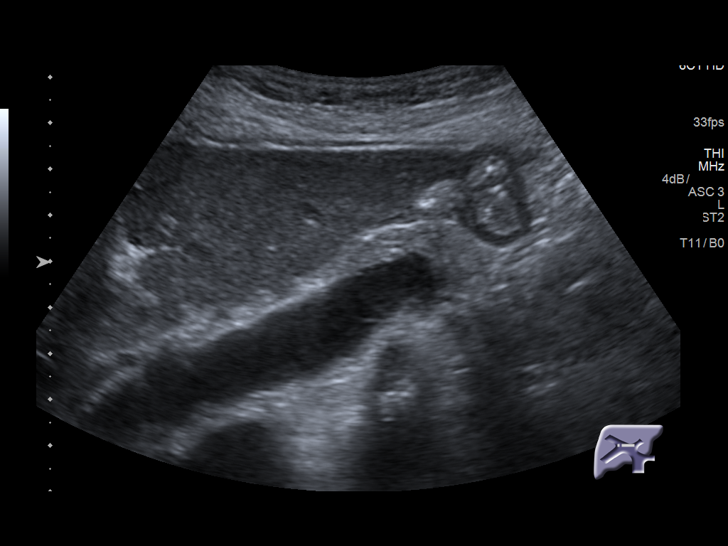
[im 31/47]
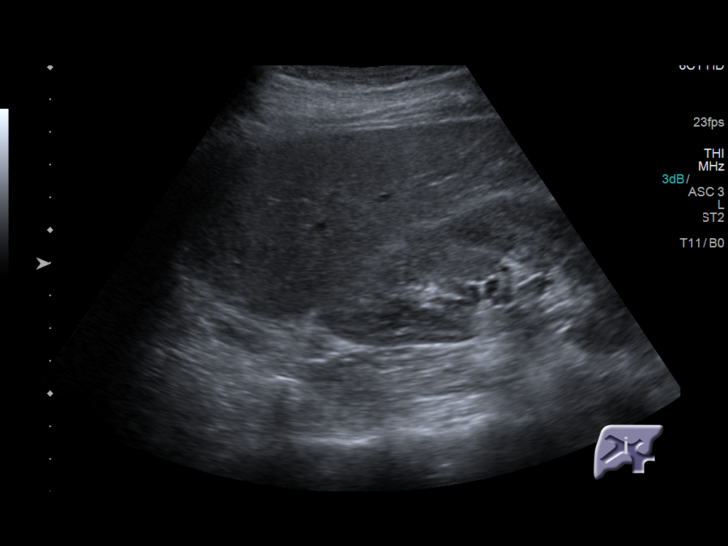
[im 35/47]
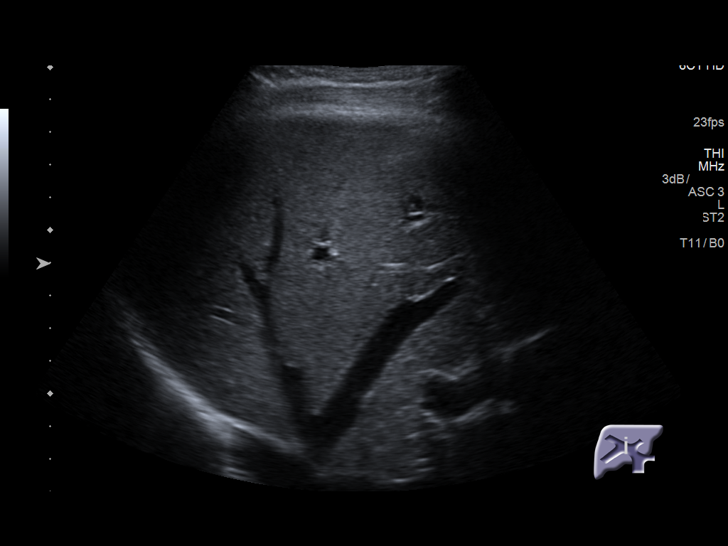
[im 39/47]
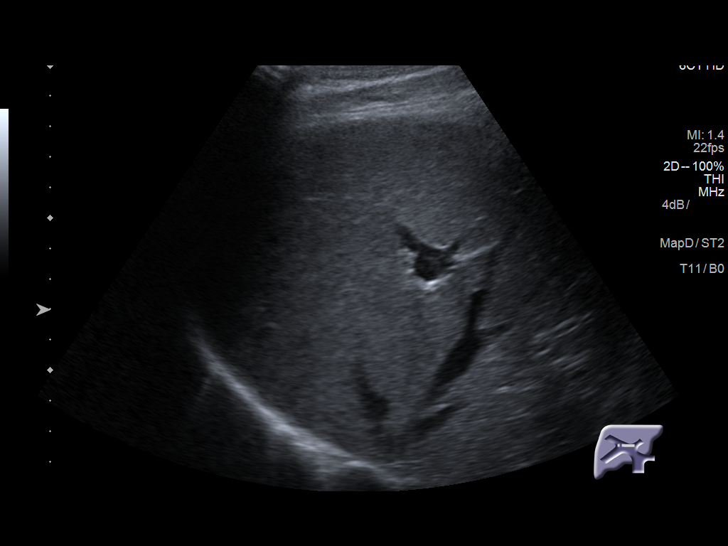
[im 43/47]
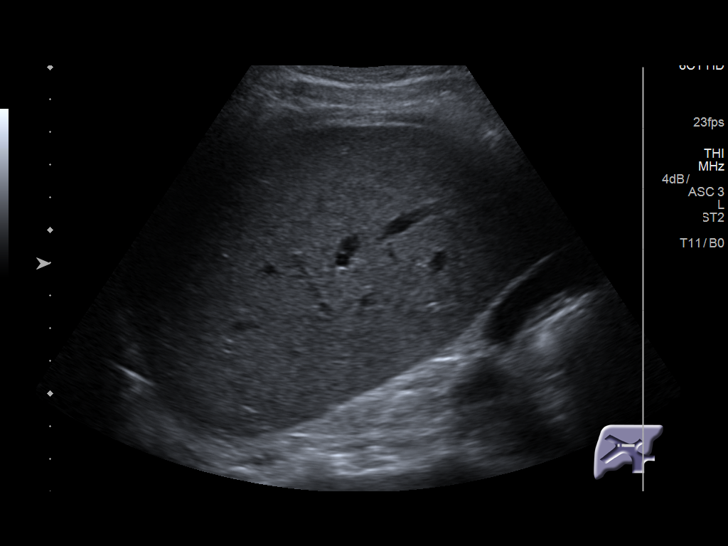
[im 47/47]
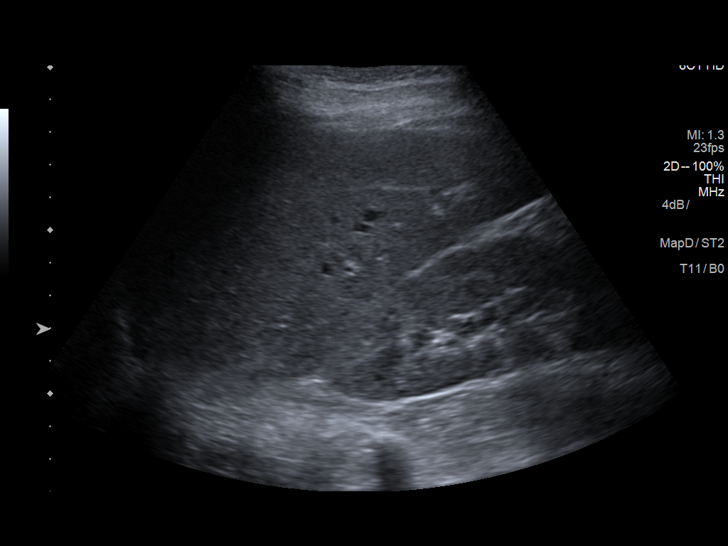

[14 of 25 positions shown; findings below may reference images not displayed]

FINDINGS: Gallbladder:

No gallstones or wall thickening visualized. No sonographic Murphy
sign noted by sonographer.

Common bile duct:

Diameter: 4.3 mm

Liver:

No focal lesion identified. Within normal limits in parenchymal
echogenicity. Portal vein is patent on color Doppler imaging with
normal direction of blood flow towards the liver.
IMPRESSION: Negative right upper quadrant ultrasound.

## 2019-12-15 IMAGING — NM NM HEPATO W/GB/PHARM/[PERSON_NAME]
2 series · 12 of 12 positions shown · non-contrast
Comparison: CT abdomen pelvis-08/01/2017

CLINICAL DATA: Postprandial right-sided abdominal pain. Evaluate
for biliary dyskinesia.

EXAM:
NUCLEAR MEDICINE HEPATOBILIARY IMAGING WITH GALLBLADDER EF
TECHNIQUE: Sequential images of the abdomen were obtained [DATE] minutes
following intravenous administration of radiopharmaceutical. After
oral ingestion of Ensure, gallbladder ejection fraction was
determined. At 60 min, normal ejection fraction is greater than 33%.
RADIOPHARMACEUTICALS:  5.24 mCi 1c-11m  Choletec IV

[Series 1000: hepatobiliary scan · 9.59mm/px · 6 of 60 frames shown]
[frame 6/60]
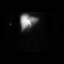
[frame 16/60]
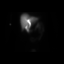
[frame 26/60]
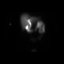
[frame 36/60]
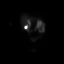
[frame 46/60]
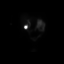
[frame 56/60]
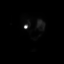

[Series 1000: gallbladder ef · 4.80mm/px · 6 of 120 frames shown]
[frame 11/120]
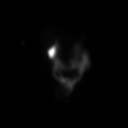
[frame 31/120]
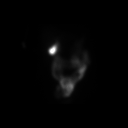
[frame 51/120]
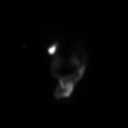
[frame 71/120]
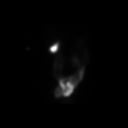
[frame 91/120]
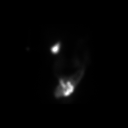
[frame 111/120]
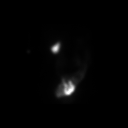

[12 of 12 positions shown; findings below may reference images not displayed]

FINDINGS: There is homogeneous distribution of injected radiotracer throughout
the hepatic parenchyma.

There is early excretion of radiotracer with opacification of the
common bile duct, initially seen on the 10 minutes anterior
projection planar image, with passage of contrast into the proximal
small bowel, initially seen on the 15 minute anterior projection
planar image.

There is brisk opacification of the gallbladder initially seen on
the 20 minute anterior projection planar image.

Following the ingestion of a fatty meal, there is appropriate
emptying of the gallbladder with estimated ejection fraction of 46%
(normal gallbladder ejection fraction with Ensure is greater than
33%).

Patient did experience abdominal pain with ingestion of fatty meal.
IMPRESSION: 1. No scintigraphic evidence of acute cholecystitis.
2. Indeterminate findings of biliary dyskinesia with normal
gallbladder ejection fraction though patient did experience
abdominal pain with ingestion of a fatty meal. Clinical correlation
is advised.

## 2019-12-25 IMAGING — CT CT CTA ABD/PEL W/CM AND/OR W/O CM
3 of 9 series · 11 of 46 positions shown, 17 images · IV contrast (iopamidol)
Comparison: CT of the abdomen and pelvis and right upper quadrant
ultrasound on 08/01/2017. Additional prior CT of the abdomen and
pelvis on 06/29/2017 and 05/03/2017.

CLINICAL DATA: Intestinal angina with periumbilical and right-sided
abdominal pain with eating and drinking. Associated weight loss.

EXAM:
CT ANGIOGRAPHY ABDOMEN AND PELVIS WITH CONTRAST AND WITHOUT CONTRAST
TECHNIQUE: Multidetector CT imaging of the abdomen and pelvis was performed
using the standard protocol during bolus administration of
intravenous contrast. Multiplanar reconstructed images and MIPs were
obtained and reviewed to evaluate the vascular anatomy.
CONTRAST:  75mL [id number] IOPAMIDOL ([id number]) INJECTION 76%

[Series 5: axial st cta arterial · axial · arterial · 0.63mm/px · z∈[-1565,-1469]mm · 3 of 215 slices shown]
[im 24/215  soft-tissue]
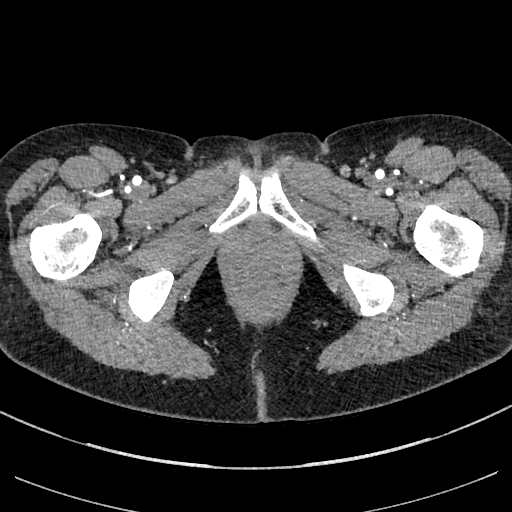
[im 48/215  soft-tissue]
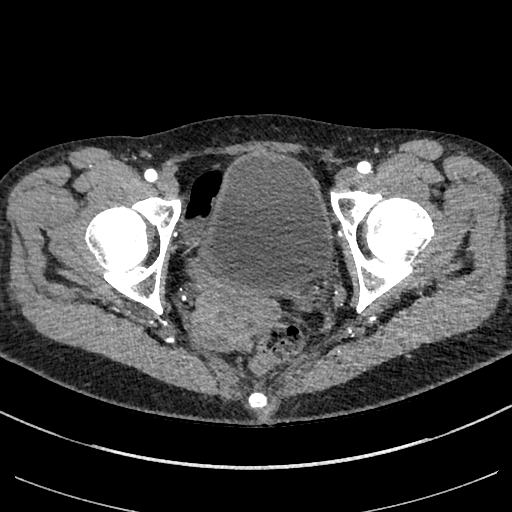
[im 72/215  soft-tissue]
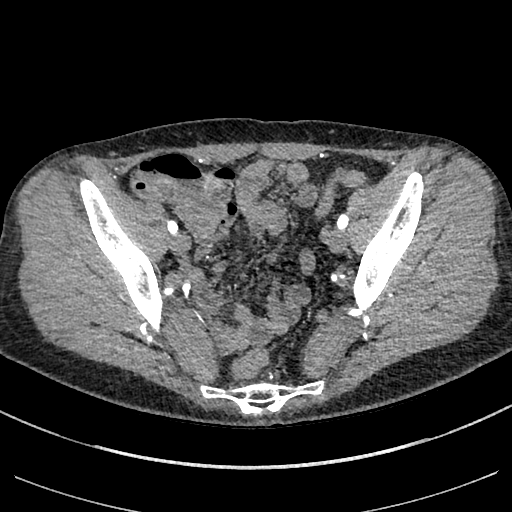

[Series 8: cor art st cta arterial · coronal · arterial · 0.63mm/px · 2 of 118 slices shown, 3 images]
[im 40/118  soft-tissue]
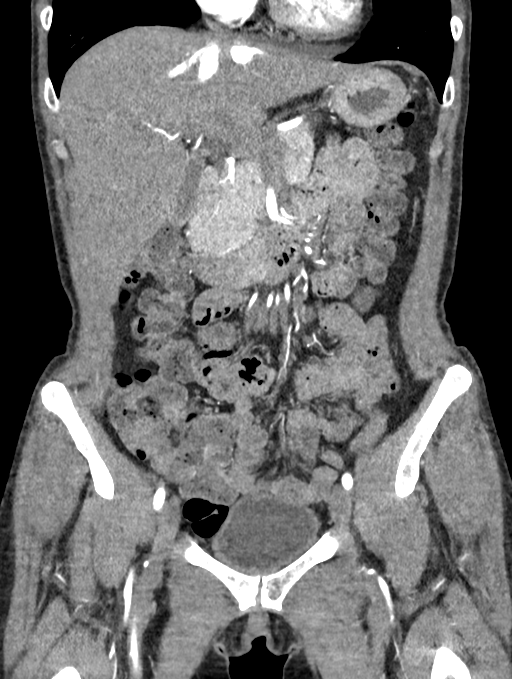
[im 40/118  bone]
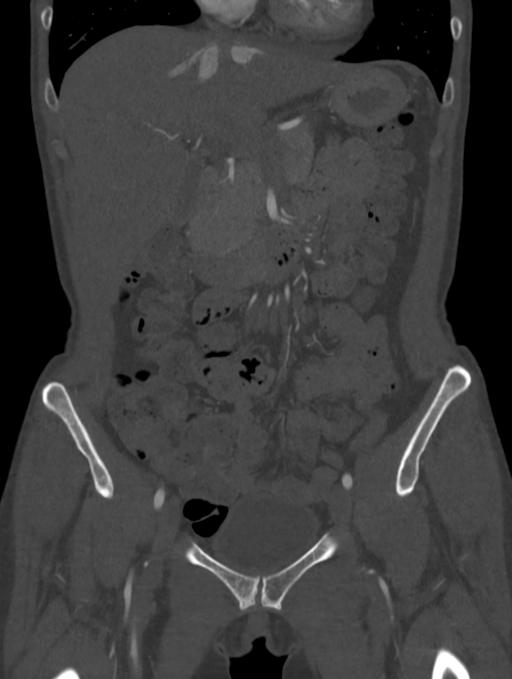
[im 79/118  soft-tissue]
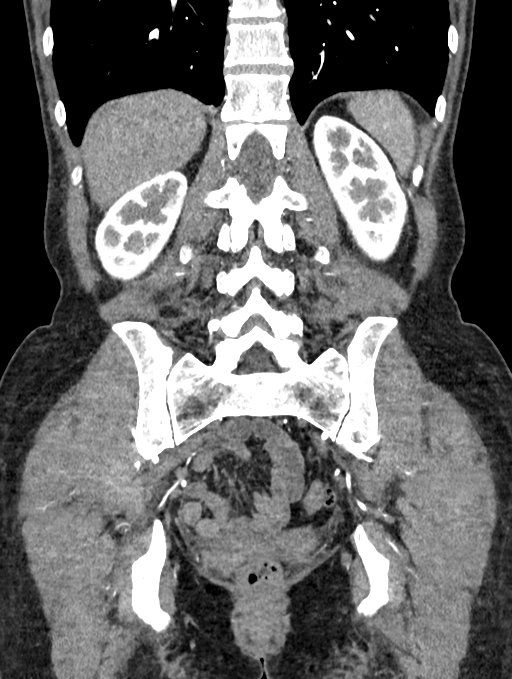

[Series 16: axial st cta venous · axial · portal-venous · 0.63mm/px · z∈[-1550,-1250]mm · 6 of 86 slices shown, 11 images]
[im 13/86  soft-tissue]
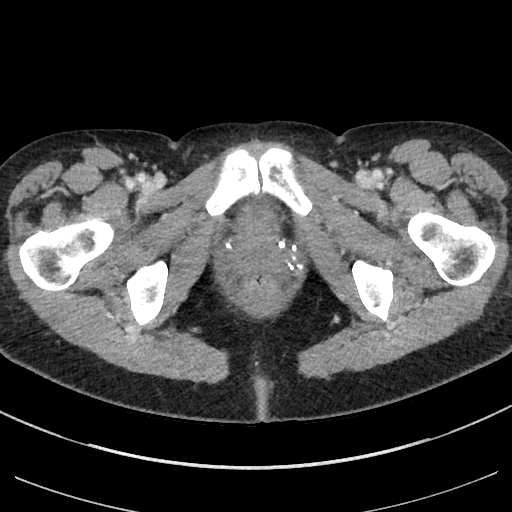
[im 13/86  bone]
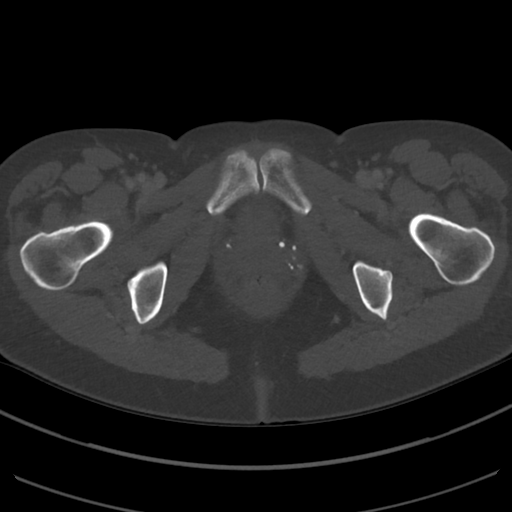
[im 25/86  soft-tissue]
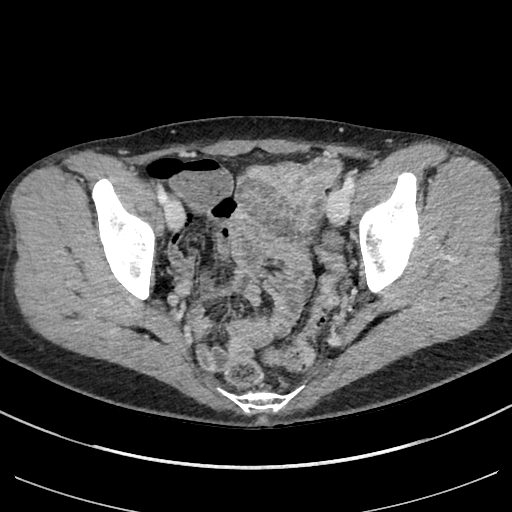
[im 37/86  soft-tissue]
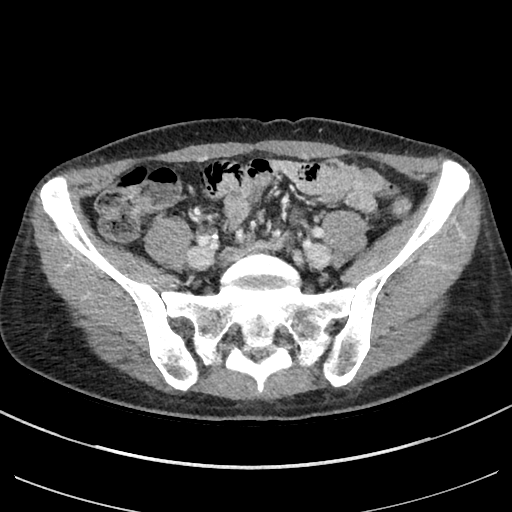
[im 37/86  lung]
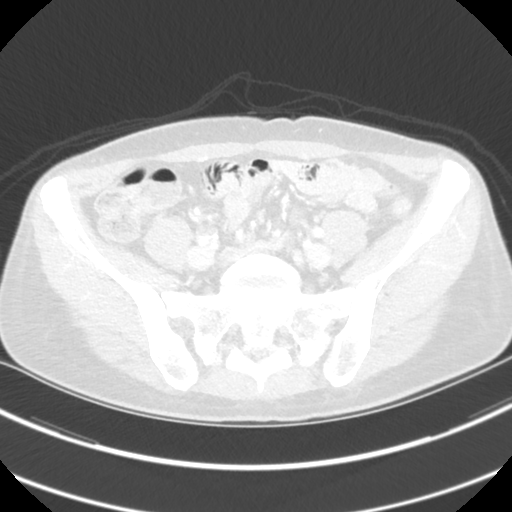
[im 49/86  soft-tissue]
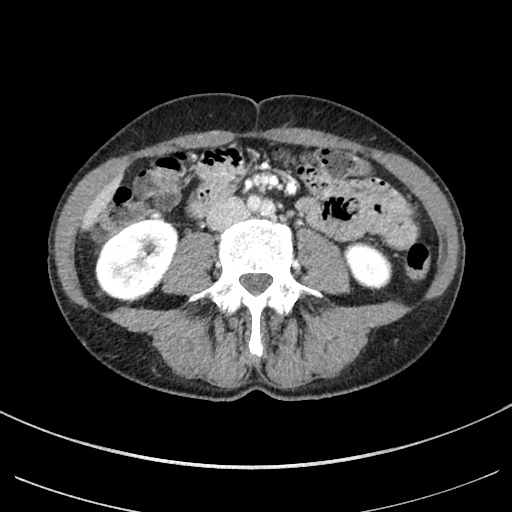
[im 49/86  lung]
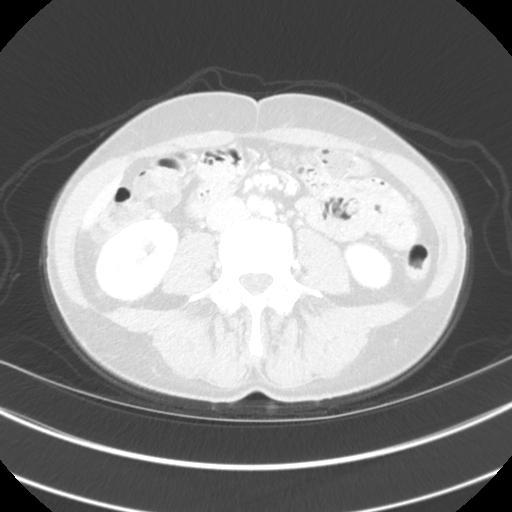
[im 61/86  soft-tissue]
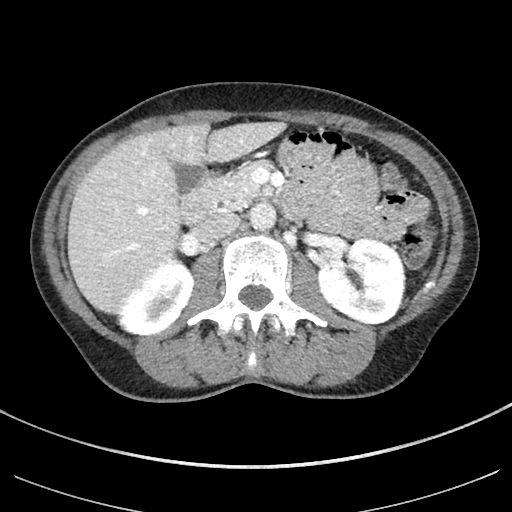
[im 61/86  lung]
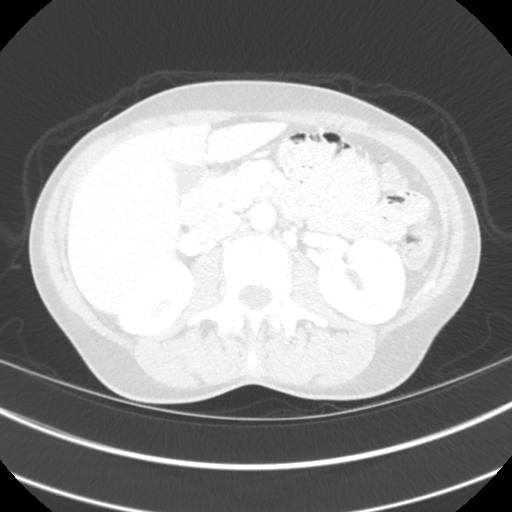
[im 73/86  soft-tissue]
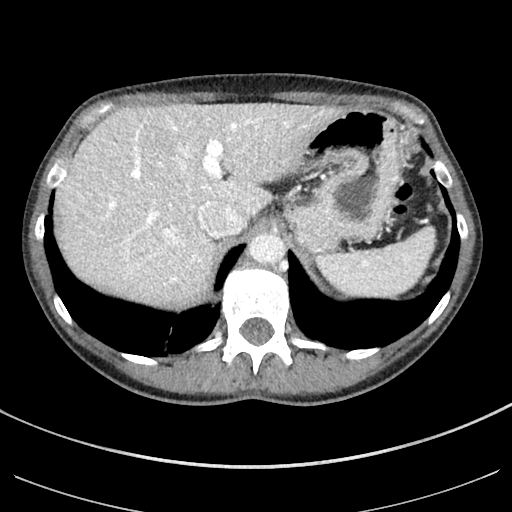
[im 73/86  lung]
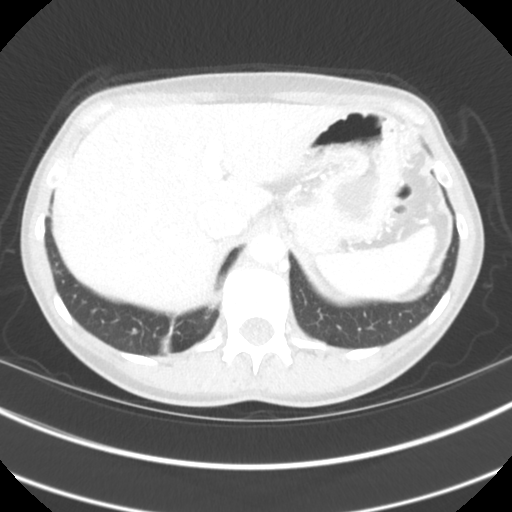

[11 of 46 positions shown; findings below may reference images not displayed]

FINDINGS: VASCULAR

Aorta: The abdominal aorta shows normal patency without evidence of
aneurysmal disease. Mild amount of calcified plaque is present in
the distal aorta. No evidence of dissection or vasculitis.

Celiac: Normally patent. The celiac axis supplies the left gastric
artery and splenic artery.

SMA: Normally patent. The common hepatic artery is completely
replaced off of the proximal SMA trunk and shows normal patency.
Distal branch vessels are well opacified and normally patent.

Renals: Bilateral single renal arteries show normal patency.

IMA: Normally patent.

Inflow: Bilateral common, external and internal iliac arteries are
normally patent. No aneurysmal disease.

Proximal Outflow: Bilateral common femoral arteries and femoral
bifurcations are normally patent.

Veins: Venous phase imaging shows normal patency of the mesenteric
veins, splenic vein, portal vein, IVC, iliac veins and common
femoral veins.

Other: There is note made of some reflux of contrast into the
intrahepatic IVC and hepatic veins at the time of arterial phase
imaging. This can be reflective of elevated right heart pressures
and correlation suggested with any history of right heart failure.

Review of the MIP images confirms the above findings.

NON-VASCULAR

Lower chest: Mild scarring at both lung bases.

Hepatobiliary: No focal liver abnormality is seen. No gallstones,
gallbladder wall thickening, or biliary dilatation.

Pancreas: Unremarkable. No pancreatic ductal dilatation or
surrounding inflammatory changes.

Spleen: Normal in size without focal abnormality.

Adrenals/Urinary Tract: Adrenal glands are unremarkable. Kidneys are
normal, without renal calculi, focal lesion, or hydronephrosis.
Bladder is unremarkable.

Stomach/Bowel: Bowel shows no evidence of obstruction, inflammation,
thickening or lesion. No evidence of diverticulitis.

Lymphatic: No enlarged lymph nodes identified in the abdomen or
pelvis.

Reproductive: Status post hysterectomy. No adnexal masses.

Other: No abdominal wall hernia or abnormality. No focal abscess or
ascites identified.

Musculoskeletal: No acute or significant osseous findings.
IMPRESSION: VASCULAR

1. Widely patent mesenteric arterial vasculature without evidence of
occlusive disease. Incidental normal variant replaced common hepatic
artery off of the SMA.
2. Mild distal aortic atherosclerosis without evidence of aneurysm
or aortic stenosis.

NON-VASCULAR

No acute findings in the abdomen or pelvis.

## 2020-11-09 ENCOUNTER — Ambulatory Visit: Payer: BC Managed Care – PPO | Admitting: Urology

## 2020-11-09 ENCOUNTER — Other Ambulatory Visit: Payer: Self-pay

## 2020-11-09 ENCOUNTER — Encounter: Payer: Self-pay | Admitting: Urology

## 2020-11-09 VITALS — BP 129/80 | HR 86 | Ht 65.0 in | Wt 145.0 lb

## 2020-11-09 DIAGNOSIS — R300 Dysuria: Secondary | ICD-10-CM

## 2020-11-09 DIAGNOSIS — R102 Pelvic and perineal pain: Secondary | ICD-10-CM | POA: Diagnosis not present

## 2020-11-09 DIAGNOSIS — R3129 Other microscopic hematuria: Secondary | ICD-10-CM

## 2020-11-09 LAB — MICROSCOPIC EXAMINATION

## 2020-11-09 LAB — URINALYSIS, COMPLETE
Bilirubin, UA: NEGATIVE
Glucose, UA: NEGATIVE
Ketones, UA: NEGATIVE
Leukocytes,UA: NEGATIVE
Nitrite, UA: NEGATIVE
Protein,UA: NEGATIVE
Specific Gravity, UA: <1.005 — ABNORMAL LOW (ref 1.005–1.030)
Urobilinogen, Ur: 0.2 mg/dL (ref 0.2–1.0)
pH, UA: 6 (ref 5.0–7.5)

## 2020-11-09 LAB — BLADDER SCAN AMB NON-IMAGING: Scan Result: 0

## 2020-11-09 NOTE — Progress Notes (Signed)
   11/09/20 12:23 PM   Conni Elliot [birth year] [id number]  CC: Pelvic pain, incontinence, right-sided flank pain  HPI: 55 year old complex female with a long smoking history who reportedly had a right-sided ureteral reflux and underwent some type of reimplant procedure with Dr. Achilles Dunk and Dr. Garner Nash over 10 years ago.  Those records are unavailable to me.  She has intermittent right-sided flank pain, chronic pelvic pain, dysuria, and some overactive symptoms and leakage with urgency.  She recently had a UA with PCP that showed microscopic hematuria and some pyuria, and was started on Cipro.  I do not see where culture was sent.  Urinalysis today is benign  CT abdomen and pelvis in 2019 showed no hydronephrosis or stones.   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   CHOLECYSTECTOMY N/A 08/29/2017   Procedure: LAPAROSCOPIC CHOLECYSTECTOMY;  Surgeon: Sung Amabile, DO;  Location: [facility] ORS;  Service: General;  Laterality: N/A;   COLONOSCOPY     DIAGNOSTIC LAPAROSCOPY     MULTIPLE   ESOPHAGOGASTRODUODENOSCOPY     KIDNEY SURGERY     REFLUX   PARTIAL HYSTERECTOMY        Family History: Family History  Problem Relation Age of Onset   Hypertension Mother    Cancer Father    Diabetes Father    Hypertension Father     Social History:  reports that she has been smoking cigarettes. She has a 5.00  pack-year smoking history. She has never used smokeless tobacco. She reports that she does not drink alcohol and does not use drugs.  Physical Exam: BP 129/80   Pulse 86   Ht 5\' 5"  (1.651 m)   Wt 145 lb (65.8 kg)   BMI 24.13 kg/m    Constitutional:  Alert and oriented, No acute distress. Cardiovascular: No clubbing, cyanosis, or edema. Respiratory: Normal respiratory effort, no increased work of breathing. GI: Abdomen is soft, nontender, nondistended, no abdominal masses   Laboratory Data: Reviewed  Pertinent Imaging: I have personally viewed and interpreted the CT from 2019 that shows no hydronephrosis or stones.  Assessment & Plan:   55 year old female with smoking history and pelvic pain and right-sided flank pain of unclear etiology, history notable for right ureteral reimplant for reflux, but CT in 2019 without any hydronephrosis.  Urinalysis today is benign  I recommended further evaluation with CT urogram and cystoscopy, and if these are both benign consideration of an overactive bladder medication  RTC for CT urogram and cystoscopy, consider OAB medication if work-up benign   2020, MD 11/09/2020  [facility] [address] [place], [place] [phone]

## 2020-11-09 NOTE — Patient Instructions (Signed)

## 2020-11-10 ENCOUNTER — Telehealth: Payer: Self-pay

## 2020-11-10 DIAGNOSIS — B379 Candidiasis, unspecified: Secondary | ICD-10-CM

## 2020-11-10 MED ORDER — FLUCONAZOLE 150 MG PO TABS: 150.0000 mg | ORAL_TABLET | Freq: Once | ORAL | 0 refills | Status: AC

## 2020-11-10 NOTE — Telephone Encounter (Signed)
Called pt informed her of the information below. Pt voiced understanding. RX sent in.  

## 2020-11-10 NOTE — Telephone Encounter (Signed)
-----   Message from Sondra Come, MD sent at 11/10/2020  8:25 AM EST ----- Recommend fluc x1 for yeast in urine, keep follow up as scheduled  Legrand Rams, MD 11/10/2020

## 2020-11-16 LAB — CULTURE, URINE COMPREHENSIVE

## 2020-11-16 LAB — MYCOPLASMA / UREAPLASMA CULTURE
Mycoplasma hominis Culture: NEGATIVE
Ureaplasma urealyticum: NEGATIVE

## 2020-11-30 ENCOUNTER — Inpatient Hospital Stay: Payer: BC Managed Care – PPO | Attending: Oncology | Admitting: Oncology

## 2020-11-30 ENCOUNTER — Inpatient Hospital Stay: Payer: BC Managed Care – PPO

## 2020-11-30 ENCOUNTER — Other Ambulatory Visit: Payer: Self-pay

## 2020-11-30 ENCOUNTER — Encounter: Payer: Self-pay | Admitting: Oncology

## 2020-11-30 VITALS — BP 113/62 | HR 78 | Temp 98.0°F | Resp 18 | Wt 145.0 lb

## 2020-11-30 DIAGNOSIS — D751 Secondary polycythemia: Secondary | ICD-10-CM | POA: Diagnosis present

## 2020-11-30 DIAGNOSIS — G4730 Sleep apnea, unspecified: Secondary | ICD-10-CM

## 2020-11-30 DIAGNOSIS — R10817 Generalized abdominal tenderness: Secondary | ICD-10-CM | POA: Diagnosis not present

## 2020-11-30 DIAGNOSIS — G8929 Other chronic pain: Secondary | ICD-10-CM | POA: Diagnosis not present

## 2020-11-30 DIAGNOSIS — K5792 Diverticulitis of intestine, part unspecified, without perforation or abscess without bleeding: Secondary | ICD-10-CM | POA: Insufficient documentation

## 2020-11-30 DIAGNOSIS — R102 Pelvic and perineal pain: Secondary | ICD-10-CM

## 2020-11-30 DIAGNOSIS — F17210 Nicotine dependence, cigarettes, uncomplicated: Secondary | ICD-10-CM | POA: Diagnosis not present

## 2020-11-30 DIAGNOSIS — R319 Hematuria, unspecified: Secondary | ICD-10-CM

## 2020-11-30 DIAGNOSIS — Z720 Tobacco use: Secondary | ICD-10-CM

## 2020-11-30 LAB — COMPREHENSIVE METABOLIC PANEL
ALT: 13 U/L (ref 0–44)
AST: 19 U/L (ref 15–41)
Albumin: 4.8 g/dL (ref 3.5–5.0)
Alkaline Phosphatase: 69 U/L (ref 38–126)
Anion gap: 6 (ref 5–15)
BUN: 7 mg/dL (ref 6–20)
CO2: 28 mmol/L (ref 22–32)
Calcium: 9.1 mg/dL (ref 8.9–10.3)
Chloride: 106 mmol/L (ref 98–111)
Creatinine, Ser: 0.7 mg/dL (ref 0.44–1.00)
GFR, Estimated: >60 mL/min (ref >60)
Glucose, Bld: 86 mg/dL (ref 70–99)
Potassium: 4.2 mmol/L (ref 3.5–5.1)
Sodium: 140 mmol/L (ref 135–145)
Total Bilirubin: 0.6 mg/dL (ref 0.3–1.2)
Total Protein: 7.5 g/dL (ref 6.5–8.1)

## 2020-11-30 LAB — CBC WITH DIFFERENTIAL/PLATELET
Abs Immature Granulocytes: 0.01 10*3/uL (ref 0.00–0.07)
Basophils Absolute: 0 10*3/uL (ref 0.0–0.1)
Basophils Relative: 1 %
Eosinophils Absolute: 0 10*3/uL (ref 0.0–0.5)
Eosinophils Relative: 1 %
HCT: 46.2 % — ABNORMAL HIGH (ref 36.0–46.0)
Hemoglobin: 15.3 g/dL — ABNORMAL HIGH (ref 12.0–15.0)
Immature Granulocytes: 0 %
Lymphocytes Relative: 56 %
Lymphs Abs: 3.6 10*3/uL (ref 0.7–4.0)
MCH: 28.6 pg (ref 26.0–34.0)
MCHC: 33.1 g/dL (ref 30.0–36.0)
MCV: 86.4 fL (ref 80.0–100.0)
Monocytes Absolute: 0.4 10*3/uL (ref 0.1–1.0)
Monocytes Relative: 7 %
Neutro Abs: 2.2 10*3/uL (ref 1.7–7.7)
Neutrophils Relative %: 35 %
Platelets: 259 10*3/uL (ref 150–400)
RBC: 5.35 MIL/uL — ABNORMAL HIGH (ref 3.87–5.11)
RDW: 13.4 % (ref 11.5–15.5)
WBC: 6.3 10*3/uL (ref 4.0–10.5)
nRBC: 0 % (ref 0.0–0.2)

## 2020-11-30 LAB — TECHNOLOGIST SMEAR REVIEW
RBC MORPHOLOGY: NORMAL
Tech Review: NORMAL
WBC MORPHOLOGY: NORMAL

## 2020-11-30 NOTE — Progress Notes (Signed)
Patient here for initial oncology appointment, expresses concerns of extreme fatigue

## 2020-11-30 NOTE — Progress Notes (Signed)
Hematology/Oncology progress note [phone] [phone]      Patient Care Team: Casilda Carls, MD as PCP - General (Internal Medicine) Ammie Dalton, Okey Regal, MD (Unknown Physician Specialty) Bary Castilla Forest Gleason, MD (General Surgery)  REFERRING PROVIDER: Casilda Carls, MD  CHIEF COMPLAINTS/REASON FOR VISIT:  Evaluation of erythrocytosis  HISTORY OF PRESENTING ILLNESS:   Linda Walker is a  55 y.o.  female with PMH listed below was seen in consultation at the request of  Casilda Carls, MD  for evaluation of erythrocytosis 10/01/2020, patient had a blood work done at primary care provider's office.  Labs from PCP office was scanned to media .  WBC 8.7, hemoglobin 16.6, hematocrit 49.6, platelet count 245.  Normal creatinine at 0.7.  Normal total bilirubin 0.4 10/31 2022, repeat CBC showed persistent elevated hemoglobin at 16, hematocrit 48.6. Patient was referred to establish care with hematology for evaluation  Patient reports history of mild sleep apnea.  She had sleep study done last year and was told that she does not need CPAP machine yet She smokes cigarettes daily, 4 to 5 cigarettes/day for the past 20 years. [ 5 pack years of smoking history].  Patient sees urology for chronic pelvic pain, incontinence and right-sided flank pain.  A CT hematuria work-up was scheduled in the near future.  Recently urine showed trace RBC.  Patient was recently treated with a course of antibiotics for UTI.   Review of Systems  Constitutional:  Positive for fatigue. Negative for appetite change, chills and fever.  HENT:   Negative for hearing loss and voice change.   Eyes:  Negative for eye problems.  Respiratory:  Negative for chest tightness and cough.   Cardiovascular:  Negative for chest pain.  Gastrointestinal:  Negative for abdominal distention, abdominal pain and blood in stool.  Endocrine: Negative for hot flashes.  Genitourinary:  Positive for pelvic pain.  Negative for difficulty urinating and frequency.        Flank pain  Musculoskeletal:  Negative for arthralgias.  Skin:  Negative for itching and rash.  Neurological:  Negative for extremity weakness.  Hematological:  Negative for adenopathy.  Psychiatric/Behavioral:  Negative for confusion.    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   CHOLECYSTECTOMY N/A 08/29/2017   Procedure: LAPAROSCOPIC CHOLECYSTECTOMY;  Surgeon: Benjamine Sprague, DO;  Location: [facility] ORS;  Service: General;  Laterality: N/A;   COLONOSCOPY     DIAGNOSTIC LAPAROSCOPY     MULTIPLE   ESOPHAGOGASTRODUODENOSCOPY     KIDNEY SURGERY     REFLUX   PARTIAL HYSTERECTOMY      SOCIAL HISTORY: Social History   Socioeconomic History   Marital status: Single    Spouse name: Not on file   Number of children: Not on file  Years of education: Not on file   Highest education level: Not on file  Occupational History   Not on file  Tobacco Use   Smoking status: Every Day    Packs/day: 0.25    Years: 20.00    Pack years: 5.00    Types: Cigarettes   Smokeless tobacco: Never  Vaping Use   Vaping Use: Never used  Substance and Sexual Activity   Alcohol use: No   Drug use: No   Sexual activity: Not Currently  Other Topics Concern   Not on file  Social History Narrative   Not on file   Social Determinants of Health   Financial  Resource Strain: Not on file  Food Insecurity: Not on file  Transportation Needs: Not on file  Physical Activity: Not on file  Stress: Not on file  Social Connections: Not on file  Intimate Partner Violence: Not on file    FAMILY HISTORY: Family History  Problem Relation Age of Onset   Hypertension Mother    Cancer Father    Diabetes Father    Hypertension Father     ALLERGIES:  is allergic to doxycycline, erythromycin, sulfa antibiotics, and sulfamethoxazole-trimethoprim.  MEDICATIONS:  Current Outpatient Medications  Medication Sig Dispense Refill   ibuprofen (ADVIL,MOTRIN) 800 MG tablet Take 1 tablet (800 mg total) by mouth every 8 (eight) hours as needed for mild pain or moderate pain. 30 tablet 0   Melatonin-Pyridoxine (MELATIN PO) Take by mouth.     polyethylene glycol (MIRALAX / GLYCOLAX) 17 g packet Take 17 g by mouth daily.     traZODone (DESYREL) 50 MG tablet Take 50 mg by mouth at bedtime as needed.     ciprofloxacin (CIPRO) 500 MG tablet SMARTSIG:1 Tablet(s) By Mouth Every 12 Hours     No current facility-administered medications for this visit.     PHYSICAL EXAMINATION: ECOG PERFORMANCE STATUS: 0 - Asymptomatic Vitals:   11/30/20 1511  BP: 113/62  Pulse: 78  Resp: 18  Temp: 98 F (36.7 C)  SpO2: 100%   Filed Weights   11/30/20 1511  Weight: 145 lb (65.8 kg)    Physical Exam Constitutional:      General: She is not in acute distress. HENT:     Head: Normocephalic and atraumatic.  Eyes:     General: No scleral icterus. Cardiovascular:     Rate and Rhythm: Normal rate and regular rhythm.     Heart sounds: Normal heart sounds.  Pulmonary:     Effort: Pulmonary effort is normal. No respiratory distress.     Breath sounds: No wheezing.  Abdominal:     General: Bowel sounds are normal. There is no distension.     Palpations: Abdomen is soft.     Comments: Patient has generalized abdomen discomfort with palpation.  Musculoskeletal:         General: No deformity. Normal range of motion.     Cervical back: Normal range of motion and neck supple.  Skin:    General: Skin is warm and dry.     Findings: No erythema or rash.  Neurological:     Mental Status: She is alert and oriented to person, place, and time. Mental status is at baseline.     Cranial Nerves: No cranial nerve deficit.     Coordination: Coordination normal.  Psychiatric:        Mood and Affect: Mood normal.    LABORATORY DATA:  I have reviewed the data as listed Lab Results  Component Value Date   WBC 6.3 11/30/2020   HGB 15.3 (H) 11/30/2020   HCT 46.2 (H) 11/30/2020   MCV 86.4 11/30/2020   PLT 259 11/30/2020   Recent Labs    11/30/20 1549  NA 140  K 4.2  CL 106  CO2 28  GLUCOSE 86  BUN 7  CREATININE 0.70  CALCIUM 9.1  GFRNONAA >60  PROT 7.5  ALBUMIN 4.8  AST 19  ALT 13  ALKPHOS 69  BILITOT 0.6   Iron/TIBC/Ferritin/ %Sat No results found for: IRON, TIBC, FERRITIN, IRONPCTSAT    RADIOGRAPHIC STUDIES: I have personally reviewed the radiological images as listed and agreed with the findings in the report. No results found.    ASSESSMENT & PLAN:  1. Erythrocytosis   2. Tobacco use   3. Generalized abdominal tenderness without rebound tenderness    Erythrocytosis, likely secondary due to smoking and underlying sleep apnea. Rule out primary etiology. Recommend to check CBC, CMP, JAK2 mutation with reflex, BCR ABL, erythropoietin level, carbon monoxide level.  Tobacco abuse, smoke cessation was discussed with patient. Generalized abdomen tenderness, patient has CT scan scheduled for work-up of hematuria.  Orders Placed This Encounter  Procedures   CBC with Differential/Platelet    Standing Status:   Future    Number of Occurrences:   1    Standing Expiration Date:   11/30/2021   Comprehensive metabolic panel    Standing Status:   Future    Number of Occurrences:   1    Standing Expiration Date:   11/30/2021   JAK2 V617F, w  Reflex to CALR/E12/MPL    Standing Status:   Future    Number of Occurrences:   1    Standing Expiration Date:   11/30/2021   BCR-ABL1 FISH    Standing Status:   Future    Number of Occurrences:   1    Standing Expiration Date:   11/30/2021   Carbon monoxide, blood (performed at ref lab)    Standing Status:   Future    Number of Occurrences:   1    Standing Expiration Date:   11/30/2021   Erythropoietin    Standing Status:   Future    Number of Occurrences:   1    Standing Expiration Date:   11/30/2021   Technologist smear review    Standing Status:   Future    Number of Occurrences:   1    Standing Expiration Date:   11/30/2021    All questions were answered. The patient knows to call the clinic with any problems questions or concerns.  cc Casilda Carls, MD    Return of visit: 3 weeks to go over results. Thank you for this kind referral and the opportunity to participate in the care of this patient. A copy of today's note is routed to referring provider   Earlie Server, MD, PhD [facility] Hematology Oncology 11/30/2020

## 2020-12-01 LAB — CARBON MONOXIDE, BLOOD (PERFORMED AT REF LAB): Carbon Monoxide, Blood: 5.5 % — ABNORMAL HIGH (ref 0.0–3.6)

## 2020-12-01 LAB — ERYTHROPOIETIN: Erythropoietin: 7.1 m[IU]/mL (ref 2.6–18.5)

## 2020-12-02 ENCOUNTER — Other Ambulatory Visit: Payer: Self-pay

## 2020-12-02 ENCOUNTER — Ambulatory Visit
Admission: RE | Admit: 2020-12-02 | Discharge: 2020-12-02 | Disposition: A | Discharge status: Home / Self Care | Payer: BC Managed Care – PPO | Source: Ambulatory Visit | Attending: Urology | Admitting: Urology

## 2020-12-02 DIAGNOSIS — R3129 Other microscopic hematuria: Secondary | ICD-10-CM | POA: Insufficient documentation

## 2020-12-02 MED ORDER — IOHEXOL 350 MG/ML SOLN
100.0000 mL | Freq: Once | INTRAVENOUS | Status: AC | PRN
  Administered 2020-12-02: 100 mL via INTRAVENOUS

## 2020-12-03 ENCOUNTER — Ambulatory Visit: Payer: BC Managed Care – PPO | Admitting: Urology

## 2020-12-03 ENCOUNTER — Encounter: Payer: Self-pay | Admitting: Urology

## 2020-12-03 VITALS — BP 113/69 | HR 75 | Ht 65.0 in | Wt 145.0 lb

## 2020-12-03 DIAGNOSIS — N3281 Overactive bladder: Secondary | ICD-10-CM

## 2020-12-03 DIAGNOSIS — R3129 Other microscopic hematuria: Secondary | ICD-10-CM

## 2020-12-03 LAB — MICROSCOPIC EXAMINATION
Bacteria, UA: NONE SEEN
Epithelial Cells (non renal): >10 /hpf — ABNORMAL HIGH (ref 0–10)

## 2020-12-03 LAB — URINALYSIS, COMPLETE
Bilirubin, UA: NEGATIVE
Glucose, UA: NEGATIVE
Ketones, UA: NEGATIVE
Leukocytes,UA: NEGATIVE
Nitrite, UA: NEGATIVE
Protein,UA: NEGATIVE
Specific Gravity, UA: 1.01 (ref 1.005–1.030)
Urobilinogen, Ur: 0.2 mg/dL (ref 0.2–1.0)
pH, UA: 5.5 (ref 5.0–7.5)

## 2020-12-03 LAB — BCR-ABL1 FISH
Cells Analyzed: 200
Cells Counted: 200

## 2020-12-03 MED ORDER — LIDOCAINE HCL URETHRAL/MUCOSAL 2 % EX GEL
1.0000 "application " | Freq: Once | CUTANEOUS | Status: AC
  Administered 2020-12-03: 1 via URETHRAL

## 2020-12-03 MED ORDER — OXYBUTYNIN CHLORIDE ER 10 MG PO TB24: 10.0000 mg | ORAL_TABLET | Freq: Every day | ORAL | 11 refills | Status: DC

## 2020-12-03 NOTE — Patient Instructions (Signed)

## 2020-12-03 NOTE — Progress Notes (Signed)
Cystoscopy Procedure Note:  Indication: Microscopic hematuria, pyuria, pelvic pain, OAB.  Patient with reported prior right ureteral reimplant surgery over 10 years ago, those records not available  After informed consent and discussion of the procedure and its risks, KALLISTA PAE was positioned and prepped in the standard fashion. Cystoscopy was performed with a flexible cystoscope. The urethra, bladder neck and entire bladder was visualized in a standard fashion. The ureteral orifices were visualized in their normal location and orientation.  Bladder mucosa grossly normal throughout, no abnormalities on retroflexion.  Urethra normal on pullback ureteroscopy.  Imaging: I personally viewed and interpreted the CT urogram that shows no evidence of hydronephrosis or stones, filling defects, or renal masses, formal radiology read pending  Findings: Normal cystoscopy  Assessment and Plan: I recommended a trial of oxybutynin for her OAB symptoms, with 6-week follow-up Will call with formal final radiology read of CT  Legrand Rams, MD 12/03/2020

## 2020-12-09 LAB — CALR + JAK2 E12-15 + MPL (REFLEXED)

## 2020-12-09 LAB — JAK2 V617F, W REFLEX TO CALR/E12/MPL

## 2020-12-21 ENCOUNTER — Inpatient Hospital Stay: Payer: BC Managed Care – PPO | Attending: Oncology | Admitting: Oncology

## 2020-12-21 ENCOUNTER — Other Ambulatory Visit: Payer: Self-pay

## 2020-12-21 VITALS — BP 107/69 | HR 81 | Temp 97.6°F | Resp 16 | Wt 146.2 lb

## 2020-12-21 DIAGNOSIS — F17210 Nicotine dependence, cigarettes, uncomplicated: Secondary | ICD-10-CM | POA: Diagnosis not present

## 2020-12-21 DIAGNOSIS — N3289 Other specified disorders of bladder: Secondary | ICD-10-CM | POA: Insufficient documentation

## 2020-12-21 DIAGNOSIS — R102 Pelvic and perineal pain: Secondary | ICD-10-CM | POA: Insufficient documentation

## 2020-12-21 DIAGNOSIS — Z720 Tobacco use: Secondary | ICD-10-CM | POA: Diagnosis not present

## 2020-12-21 DIAGNOSIS — D751 Secondary polycythemia: Secondary | ICD-10-CM | POA: Diagnosis present

## 2020-12-21 DIAGNOSIS — G8929 Other chronic pain: Secondary | ICD-10-CM | POA: Diagnosis not present

## 2020-12-21 DIAGNOSIS — G4730 Sleep apnea, unspecified: Secondary | ICD-10-CM | POA: Insufficient documentation

## 2020-12-21 NOTE — Progress Notes (Signed)
Pt recently started on oxybutynin for bladder spasms. No concerns at this time.

## 2020-12-23 ENCOUNTER — Encounter: Payer: Self-pay | Admitting: Oncology

## 2020-12-23 NOTE — Progress Notes (Signed)
Hematology/Oncology progress note [phone] [phone]      Patient Care Team: Sherrie Mustache, MD as PCP - General (Internal Medicine) Arvil Chaco, Belia Heman, MD (Unknown Physician Specialty) Lemar Livings Merrily Pew, MD (General Surgery)  REFERRING PROVIDER: Sherrie Mustache, MD  CHIEF COMPLAINTS/REASON FOR VISIT:  Evaluation of erythrocytosis  HISTORY OF PRESENTING ILLNESS:   Linda Walker is a  55 y.o.  female with PMH listed below was seen in consultation at the request of  Sherrie Mustache, MD  for evaluation of erythrocytosis 10/01/2020, patient had a blood work done at primary care provider's office.  Labs from PCP office was scanned to media .  WBC 8.7, hemoglobin 16.6, hematocrit 49.6, platelet count 245.  Normal creatinine at 0.7.  Normal total bilirubin 0.4 10/31 2022, repeat CBC showed persistent elevated hemoglobin at 16, hematocrit 48.6. Patient was referred to establish care with hematology for evaluation  Patient reports history of mild sleep apnea.  She had sleep study done last year and was told that she does not need CPAP machine yet She smokes cigarettes daily, 4 to 5 cigarettes/day for the past 20 years. [ 5 pack years of smoking history].  Patient sees urology for chronic pelvic pain, incontinence and right-sided flank pain.  A CT hematuria work-up was scheduled in the near future.  Recently urine showed trace RBC.  Patient was recently treated with a course of antibiotics for UTI.  INTERVAL HISTORY Linda Walker is a 55 y.o. female who has above history reviewed by me today presents for follow up visit for erythrocytosis. Patient started on oxybutynin for bladder spasm.  He has no new complaints.    Review of Systems  Constitutional:  Positive for fatigue. Negative for appetite change, chills and fever.  HENT:   Negative for hearing loss and voice change.   Eyes:  Negative for eye problems.  Respiratory:  Negative for chest tightness  and cough.   Cardiovascular:  Negative for chest pain.  Gastrointestinal:  Negative for abdominal distention, abdominal pain and blood in stool.  Endocrine: Negative for hot flashes.  Genitourinary:  Positive for pelvic pain. Negative for difficulty urinating and frequency.        Flank pain  Musculoskeletal:  Negative for arthralgias.  Skin:  Negative for itching and rash.  Neurological:  Negative for extremity weakness.  Hematological:  Negative for adenopathy.  Psychiatric/Behavioral:  Negative for confusion.    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   CHOLECYSTECTOMY N/A 08/29/2017   Procedure: LAPAROSCOPIC CHOLECYSTECTOMY;  Surgeon: Sung Amabile, DO;  Location: [facility] ORS;  Service: General;  Laterality: N/A;   COLONOSCOPY     DIAGNOSTIC LAPAROSCOPY     MULTIPLE   ESOPHAGOGASTRODUODENOSCOPY     KIDNEY SURGERY  REFLUX   PARTIAL HYSTERECTOMY      SOCIAL HISTORY: Social History   Socioeconomic History   Marital status: Single    Spouse name: Not on file   Number of children: Not on file   Years of education: Not on file   Highest education level: Not on file  Occupational History   Not on file  Tobacco Use   Smoking status: Every Day    Packs/day: 0.25    Years: 20.00    Pack years: 5.00    Types: Cigarettes   Smokeless tobacco: Never  Vaping Use   Vaping Use: Never used   Substance and Sexual Activity   Alcohol use: No   Drug use: No   Sexual activity: Not Currently  Other Topics Concern   Not on file  Social History Narrative   Not on file   Social Determinants of Health   Financial Resource Strain: Not on file  Food Insecurity: Not on file  Transportation Needs: Not on file  Physical Activity: Not on file  Stress: Not on file  Social Connections: Not on file  Intimate Partner Violence: Not on file    FAMILY HISTORY: Family History  Problem Relation Age of Onset   Hypertension Mother    Cancer Father    Diabetes Father    Hypertension Father     ALLERGIES:  is allergic to doxycycline, erythromycin, sulfa antibiotics, and sulfamethoxazole-trimethoprim.  MEDICATIONS:  Current Outpatient Medications  Medication Sig Dispense Refill   ergocalciferol (VITAMIN D2) 1.25 MG (50000 UT) capsule Take 50,000 Units by mouth once a week.     ibuprofen (ADVIL,MOTRIN) 800 MG tablet Take 1 tablet (800 mg total) by mouth every 8 (eight) hours as needed for mild pain or moderate pain. 30 tablet 0   Melatonin-Pyridoxine (MELATIN PO) Take by mouth.     oxybutynin (DITROPAN-XL) 10 MG 24 hr tablet Take 1 tablet (10 mg total) by mouth daily. 30 tablet 11   polyethylene glycol (MIRALAX / GLYCOLAX) 17 g packet Take 17 g by mouth daily.     traZODone (DESYREL) 50 MG tablet Take 50 mg by mouth at bedtime as needed.     No current facility-administered medications for this visit.     PHYSICAL EXAMINATION: ECOG PERFORMANCE STATUS: 0 - Asymptomatic Vitals:   12/21/20 1453  BP: 107/69  Pulse: 81  Resp: 16  Temp: 97.6 F (36.4 C)  SpO2: 99%   Filed Weights   12/21/20 1453  Weight: 146 lb 3.2 oz (66.3 kg)    Physical Exam Constitutional:      General: She is not in acute distress. HENT:     Head: Normocephalic and atraumatic.  Eyes:     General: No scleral icterus. Cardiovascular:     Rate and Rhythm: Normal rate and regular rhythm.     Heart  sounds: Normal heart sounds.  Pulmonary:     Effort: Pulmonary effort is normal. No respiratory distress.     Breath sounds: No wheezing.  Abdominal:     General: Bowel sounds are normal. There is no distension.     Palpations: Abdomen is soft.     Comments: Patient has generalized abdomen discomfort with palpation.  Musculoskeletal:        General: No deformity. Normal range of motion.     Cervical back: Normal range of motion and neck supple.  Skin:    General: Skin is warm and dry.     Findings: No erythema or rash.  Neurological:  Mental Status: She is alert and oriented to person, place, and time. Mental status is at baseline.     Cranial Nerves: No cranial nerve deficit.     Coordination: Coordination normal.  Psychiatric:        Mood and Affect: Mood normal.    LABORATORY DATA:  I have reviewed the data as listed Lab Results  Component Value Date   WBC 6.3 11/30/2020   HGB 15.3 (H) 11/30/2020   HCT 46.2 (H) 11/30/2020   MCV 86.4 11/30/2020   PLT 259 11/30/2020   Recent Labs    11/30/20 1549  NA 140  K 4.2  CL 106  CO2 28  GLUCOSE 86  BUN 7  CREATININE 0.70  CALCIUM 9.1  GFRNONAA >60  PROT 7.5  ALBUMIN 4.8  AST 19  ALT 13  ALKPHOS 69  BILITOT 0.6    Iron/TIBC/Ferritin/ %Sat No results found for: IRON, TIBC, FERRITIN, IRONPCTSAT    RADIOGRAPHIC STUDIES: I have personally reviewed the radiological images as listed and agreed with the findings in the report. CT HEMATURIA WORKUP  Result Date: 12/03/2020 CLINICAL DATA:  Intermittent right flank pain. Urinary frequency. Microhematuria. History of nephrolithiasis. History of ureteral reimplantation. EXAM: CT ABDOMEN AND PELVIS WITHOUT AND WITH CONTRAST TECHNIQUE: Multidetector CT imaging of the abdomen and pelvis was performed following the standard protocol before and following the bolus administration of intravenous contrast. CONTRAST:  155mL OMNIPAQUE IOHEXOL 350 MG/ML SOLN COMPARISON:  12/25/2017 CT  abdomen/pelvis. FINDINGS: Lower chest: Peripheral left lower lobe 3 mm solid pulmonary nodule (series 4/image 7), stable and considered benign. No acute abnormality at the lung bases. Hepatobiliary: Normal liver size. No liver mass. Cholecystectomy. No biliary ductal dilatation. Pancreas: Normal, with no mass or duct dilation. Spleen: Normal size. No mass. Adrenals/Urinary Tract: Normal adrenals. Two subcentimeter hypodense renal cortical lesions in the posterior lower right kidney, too small to characterize, unchanged, considered benign. No suspicious renal cortical masses. Symmetric normal contrast nephrograms. No hydronephrosis. No renal stones. Normal caliber ureters. No ureteral stones. On delayed imaging, there is no urothelial wall thickening and there are no filling defects in the opacified portions of the bilateral collecting systems or ureters. No bladder stones, wall thickening, masses or diverticula. Stomach/Bowel: Small hiatal hernia. Otherwise normal nondistended stomach. Normal caliber small bowel with no small bowel wall thickening. Normal appendix. Normal large bowel with no diverticulosis, large bowel wall thickening or pericolonic fat stranding. Vascular/Lymphatic: Atherosclerotic nonaneurysmal abdominal aorta. Patent portal, splenic, hepatic and renal veins. No pathologically enlarged lymph nodes in the abdomen or pelvis. Reproductive: Status post hysterectomy, with no abnormal findings at the vaginal cuff. No adnexal mass. Other: No pneumoperitoneum, ascites or focal fluid collection. Musculoskeletal: No aggressive appearing focal osseous lesions. Mild thoracolumbar spondylosis. IMPRESSION: 1. No urolithiasis. No hydronephrosis. No suspicious renal cortical masses. No evidence of urothelial lesions. 2. Small hiatal hernia. 3. Aortic Atherosclerosis (ICD10-I70.0). Electronically Signed   By: Ilona Sorrel M.D.   On: 12/03/2020 16:42      ASSESSMENT & PLAN:  1. Erythrocytosis   2. Tobacco use     Labs reviewed and discussed with patient. JAK2 V617F mutation negative, with reflex to other mutations CALR, MPL, JAK 2 Ex 12-15 mutations negative. BCR ABL 1 FISH negative, increased carbon monoxide level.  Normal erythropoietin level. 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 Hemoglobin is less than 50, I will hold off phlebotomy.  I discussed about option for her to continue follow-up  with [place] yearly versus her follow-up with primary care provider.  Patient opted to follow-up with PCP.  Patient will be discharged. Smoking cessation was discussed with patient.  Abdominal pain, CT hematuria work-up showed no urolithiasis.  No hydronephrosis.  Hiatal hernia aortic atherosclerosis. She follows up with urology for bladder spasm   All questions were answered. The patient knows to call the clinic with any problems questions or concerns.  cc Casilda Carls, MD    Thank you for this kind referral and the opportunity to participate in the care of this patient. A copy of today's note is routed to referring provider   Earlie Server, MD, PhD [facility] Hematology Oncology 12/23/2020

## 2021-01-14 ENCOUNTER — Ambulatory Visit: Payer: BC Managed Care – PPO | Admitting: Urology

## 2021-02-01 LAB — HM PAP SMEAR

## 2021-02-02 ENCOUNTER — Other Ambulatory Visit: Payer: Self-pay | Admitting: Internal Medicine

## 2021-02-02 DIAGNOSIS — N644 Mastodynia: Secondary | ICD-10-CM

## 2021-02-02 DIAGNOSIS — Z13820 Encounter for screening for osteoporosis: Secondary | ICD-10-CM

## 2021-02-22 ENCOUNTER — Other Ambulatory Visit: Payer: Self-pay

## 2021-02-22 ENCOUNTER — Ambulatory Visit
Admission: RE | Admit: 2021-02-22 | Discharge: 2021-02-22 | Disposition: A | Discharge status: Home / Self Care | Payer: BC Managed Care – PPO | Source: Ambulatory Visit | Attending: Internal Medicine | Admitting: Internal Medicine

## 2021-02-22 DIAGNOSIS — N644 Mastodynia: Secondary | ICD-10-CM | POA: Insufficient documentation

## 2021-02-23 ENCOUNTER — Other Ambulatory Visit: Payer: Self-pay | Admitting: Internal Medicine

## 2021-02-24 ENCOUNTER — Other Ambulatory Visit: Payer: Self-pay | Admitting: Internal Medicine

## 2021-02-24 DIAGNOSIS — N6310 Unspecified lump in the right breast, unspecified quadrant: Secondary | ICD-10-CM

## 2021-03-23 ENCOUNTER — Other Ambulatory Visit: Payer: Self-pay

## 2021-03-23 ENCOUNTER — Ambulatory Visit
Admission: RE | Admit: 2021-03-23 | Discharge: 2021-03-23 | Disposition: A | Discharge status: Home / Self Care | Payer: BC Managed Care – PPO | Source: Ambulatory Visit | Attending: Internal Medicine | Admitting: Internal Medicine

## 2021-03-23 DIAGNOSIS — Z13820 Encounter for screening for osteoporosis: Secondary | ICD-10-CM | POA: Insufficient documentation

## 2021-04-08 ENCOUNTER — Encounter: Payer: Self-pay | Admitting: Obstetrics and Gynecology

## 2021-04-08 ENCOUNTER — Ambulatory Visit (INDEPENDENT_AMBULATORY_CARE_PROVIDER_SITE_OTHER): Payer: BC Managed Care – PPO | Admitting: Obstetrics and Gynecology

## 2021-04-08 VITALS — BP 106/57 | HR 64 | Ht 65.0 in | Wt 138.7 lb

## 2021-04-08 DIAGNOSIS — Z7689 Persons encountering health services in other specified circumstances: Secondary | ICD-10-CM

## 2021-04-08 DIAGNOSIS — R87612 Low grade squamous intraepithelial lesion on cytologic smear of cervix (LGSIL): Secondary | ICD-10-CM | POA: Diagnosis not present

## 2021-04-08 DIAGNOSIS — B977 Papillomavirus as the cause of diseases classified elsewhere: Secondary | ICD-10-CM | POA: Diagnosis not present

## 2021-04-08 NOTE — Progress Notes (Signed)
Patient presents today for abnormal pap smear. She states she has not had a history of abnormal pap smears and ws surprised about her HPV diagnosis. Patient has had a hysterectomy. Patient states no other questions or concerns. ?

## 2021-04-08 NOTE — Progress Notes (Signed)
HPI: ?     [name] is a 56 y.o. [id number] who LMP was No LMP recorded. Patient has had a hysterectomy. ? ?Subjective:  ? ?She presents today because she most recently had a Pap smear showing LGSIL with positive HPV.  She states that she has had abnormal Pap smears in the past but they never required any kind of treatment. ?Of significant note, patient has had a vaginal hysterectomy for prolapse.  This of course makes her most recent Pap a vaginal cuff Pap smear. ?Patient smokes cigarettes daily but is in the process of "quitting". ? ?  Hx: ?The following portions of the patient's history were reviewed and updated as appropriate: ?            She  has a past medical history of Complication of anesthesia, Diverticulitis, GERD (gastroesophageal reflux disease), Headache, History of kidney stones, Low blood pressure, Pituitary mass (HCC), Seizure (HCC) (2006), and TIA (transient ischemic attack) (2006). ?She does not have any pertinent problems on file. ?She  has a past surgical history that includes Bladder suspension (2005 ?); Abdominal hysterectomy (1997); Kidney surgery; Partial hysterectomy; Diagnostic laparoscopy; Colonoscopy; Esophagogastroduodenoscopy; and Cholecystectomy (N/A, 08/29/2017). ?Her family history includes Cancer in her father; Diabetes in her father; Hypertension in her father and mother. ?She  reports that she has been smoking cigarettes. She has a 5.00 pack-year smoking history. She has never used smokeless tobacco. She reports that she does not drink alcohol and does not use drugs. ?She has a current medication list which includes the following prescription(s): calcium carbonate-vit d-min, ergocalciferol, ibuprofen, melatonin-pyridoxine, multivitamin, oxybutynin, polyethylene glycol, and trazodone. ?She is allergic to doxycycline, erythromycin, sulfa antibiotics, and sulfamethoxazole-trimethoprim. ?      ?Review of Systems:  ?Review of Systems ? ?Constitutional: Denied  constitutional symptoms, night sweats, recent illness, fatigue, fever, insomnia and weight loss.  ?Eyes: Denied eye symptoms, eye pain, photophobia, vision change and visual disturbance.  ?Ears/Nose/Throat/Neck: Denied ear, nose, throat or neck symptoms, hearing loss, nasal discharge, sinus congestion and sore throat.  ?Cardiovascular: Denied cardiovascular symptoms, arrhythmia, chest pain/pressure, edema, exercise intolerance, orthopnea and palpitations.  ?Respiratory: Denied pulmonary symptoms, asthma, pleuritic pain, productive sputum, cough, dyspnea and wheezing.  ?Gastrointestinal: Denied, gastro-esophageal reflux, melena, nausea and vomiting.  ?Genitourinary: See HPI for additional information.  ?Musculoskeletal: Denied musculoskeletal symptoms, stiffness, swelling, muscle weakness and myalgia.  ?Dermatologic: Denied dermatology symptoms, rash and scar.  ?Neurologic: Denied neurology symptoms, dizziness, headache, neck pain and syncope.  ?Psychiatric: Denied psychiatric symptoms, anxiety and depression.  ?Endocrine: Denied endocrine symptoms including hot flashes and night sweats.  ? ?Meds: ?  ?Current Outpatient Medications on File Prior to Visit  ?Medication Sig Dispense Refill  ? Calcium Carbonate-Vit D-Min (CALCIUM 1200 PO) Take by mouth.    ? ergocalciferol (VITAMIN D2) 1.25 MG (50000 UT) capsule Take 50,000 Units by mouth once a week.    ? ibuprofen (ADVIL,MOTRIN) 800 MG tablet Take 1 tablet (800 mg total) by mouth every 8 (eight) hours as needed for mild pain or moderate pain. 30 tablet 0  ? Melatonin-Pyridoxine (MELATIN PO) Take by mouth.    ? Multiple Vitamin (MULTIVITAMIN) capsule Take 1 capsule by mouth daily.    ? oxybutynin (DITROPAN-XL) 10 MG 24 hr tablet Take 1 tablet (10 mg total) by mouth daily. 30 tablet 11  ? polyethylene glycol (MIRALAX / GLYCOLAX) 17 g packet Take 17 g by mouth daily.    ? traZODone (DESYREL) 50 MG tablet Take 50 mg by mouth  at bedtime as needed.    ? ?No current  facility-administered medications on file prior to visit.  ? ? ? ? ?Objective:  ?  ? ?Vitals:  ? 04/08/21 1116  ?BP: (!) 106/57  ?Pulse: 64  ? ?Filed Weights  ? 04/08/21 1116  ?Weight: 138 lb 11.2 oz (62.9 kg)  ? ?  ?         Most recent Pap smear results reviewed. ?        ? ?Assessment/Plan:  ?  ?[id number] ?Patient Active Problem List  ? Diagnosis Date Noted  ? Diverticulitis 11/30/2020  ? Axillary mass 08/21/2013  ? ?  ?1. Establishing care with new doctor, encounter for   ?2. 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 ? Because this is a vaginal cuff Pap, and she has had a prior hysterectomy for not cervical related reasons, this would not necessarily be a Pap that was indicated.  However now that is abnormal the question is what to do.  As vaginal cancer is very rare and based on this sample she has LGSIL I think it is reasonable to recheck her Pap smear of the vaginal cuff with viral genotyping added in 1 year.  This is especially true as she is intending to quit smoking during that time and this may improve her cytology and immune reaction to HPV. ?I have offered to perform colposcopy of the vaginal cuff today and after a long discussion she has decided to have a repeat Pap with viral genotyping done next year.  I think this is entirely appropriate. ?Natural course and history of HPV is relationship to cervical and vaginal cancer discussed in detail.  All questions were answered. ? ? ?  ?  ?       ? ?Orders ?Orders Placed This Encounter  ?Procedures  ? HM PAP SMEAR  ? ? No orders of the defined types were placed in this encounter. ?  ?  F/U ? Return in about 1 year (around 04/09/2022). ?I spent 33 minutes involved in the care of this patient preparing to see the patient by obtaining and reviewing her medical history (including labs, imaging tests and prior procedures), documenting clinical information in the electronic health record (EHR), counseling and  coordinating care plans, writing and sending prescriptions, ordering tests or procedures and in direct communicating with the patient and medical staff discussing pertinent items from her history and physical exam. ? ?Elonda Husky, M.D. ?04/08/2021 ?1:53 PM ? ? ? ? ?

## 2021-06-11 ENCOUNTER — Other Ambulatory Visit: Payer: Self-pay | Admitting: Gastroenterology

## 2021-06-11 DIAGNOSIS — R1032 Left lower quadrant pain: Secondary | ICD-10-CM

## 2021-06-25 ENCOUNTER — Ambulatory Visit
Admission: RE | Admit: 2021-06-25 | Discharge: 2021-06-25 | Disposition: A | Discharge status: Home / Self Care | Payer: BC Managed Care – PPO | Source: Ambulatory Visit | Attending: Gastroenterology | Admitting: Gastroenterology

## 2021-06-25 DIAGNOSIS — R1032 Left lower quadrant pain: Secondary | ICD-10-CM | POA: Insufficient documentation

## 2021-07-30 ENCOUNTER — Ambulatory Visit: Payer: BC Managed Care – PPO | Admitting: Physical Therapy

## 2021-08-06 ENCOUNTER — Encounter: Payer: BC Managed Care – PPO | Admitting: Physical Therapy

## 2021-08-13 ENCOUNTER — Encounter: Payer: BC Managed Care – PPO | Admitting: Physical Therapy

## 2021-08-20 ENCOUNTER — Encounter: Payer: BC Managed Care – PPO | Admitting: Physical Therapy

## 2021-08-27 ENCOUNTER — Encounter: Payer: BC Managed Care – PPO | Admitting: Physical Therapy

## 2021-09-02 ENCOUNTER — Encounter: Payer: BC Managed Care – PPO | Admitting: Physical Therapy

## 2022-02-25 ENCOUNTER — Other Ambulatory Visit: Payer: Self-pay | Admitting: Internal Medicine

## 2022-02-25 DIAGNOSIS — N6001 Solitary cyst of right breast: Secondary | ICD-10-CM

## 2022-03-09 ENCOUNTER — Ambulatory Visit
Admission: RE | Admit: 2022-03-09 | Discharge: 2022-03-09 | Disposition: A | Discharge status: Home / Self Care | Payer: BC Managed Care – PPO | Source: Ambulatory Visit | Attending: Internal Medicine | Admitting: Internal Medicine

## 2022-03-09 DIAGNOSIS — N6001 Solitary cyst of right breast: Secondary | ICD-10-CM | POA: Insufficient documentation

## 2022-03-30 ENCOUNTER — Other Ambulatory Visit: Payer: Self-pay | Admitting: Internal Medicine

## 2022-03-30 DIAGNOSIS — N63 Unspecified lump in unspecified breast: Secondary | ICD-10-CM

## 2022-04-19 ENCOUNTER — Other Ambulatory Visit (HOSPITAL_COMMUNITY)
Admission: RE | Admit: 2022-04-19 | Discharge: 2022-04-19 | Disposition: A | Discharge status: Home / Self Care | Payer: BC Managed Care – PPO | Source: Ambulatory Visit | Attending: Obstetrics and Gynecology | Admitting: Obstetrics and Gynecology

## 2022-04-19 ENCOUNTER — Encounter: Payer: Self-pay | Admitting: Obstetrics and Gynecology

## 2022-04-19 ENCOUNTER — Ambulatory Visit: Payer: BC Managed Care – PPO | Admitting: Obstetrics and Gynecology

## 2022-04-19 VITALS — BP 105/70 | HR 90 | Ht 65.0 in | Wt 148.0 lb

## 2022-04-19 DIAGNOSIS — N907 Vulvar cyst: Secondary | ICD-10-CM

## 2022-04-19 DIAGNOSIS — Z1272 Encounter for screening for malignant neoplasm of vagina: Secondary | ICD-10-CM | POA: Insufficient documentation

## 2022-04-19 DIAGNOSIS — Z01419 Encounter for gynecological examination (general) (routine) without abnormal findings: Secondary | ICD-10-CM | POA: Insufficient documentation

## 2022-04-19 DIAGNOSIS — B977 Papillomavirus as the cause of diseases classified elsewhere: Secondary | ICD-10-CM

## 2022-04-19 DIAGNOSIS — N951 Menopausal and female climacteric states: Secondary | ICD-10-CM

## 2022-04-19 NOTE — Progress Notes (Signed)
HPI:      [name] is a 57 y.o. [phone] who LMP was No LMP recorded. Patient has had a hysterectomy.  Subjective:   She presents today for her annual examination.  She states that she has generally been doing well.  She is not sexually active.  She does state that she has a small cyst on her left labia and she would like it examined.  It is in the way of her underwear occasionally and can be irritating for her. I significant note patient had an abnormal Pap of the vaginal cuff last year with positive HPV.  She declined colposcopy.    Hx: The following portions of the patient's history were reviewed and updated as appropriate:             She  has a past medical history of Complication of anesthesia, Diverticulitis, GERD (gastroesophageal reflux disease), Headache, History of kidney stones, Low blood pressure, Pituitary mass, Seizure (2006), and TIA (transient ischemic attack) (2006). She does not have any pertinent problems on file. She  has a past surgical history that includes Bladder suspension (2005 ?); Abdominal hysterectomy (1997); Kidney surgery; Partial hysterectomy; Diagnostic laparoscopy; Colonoscopy; Esophagogastroduodenoscopy; and Cholecystectomy (N/A, 08/29/2017). Her family history includes Cancer in her father; Diabetes in her father; Hypertension in her father and mother. She  reports that she has been smoking cigarettes. She has a 5.00 pack-year smoking history. She has never used smokeless tobacco. She reports that she does not drink alcohol and does not use drugs. She has a current medication list which includes the following prescription(s): calcium carbonate-vit d-min, ergocalciferol, ibuprofen, melatonin-pyridoxine, multivitamin, polyethylene glycol, and trazodone. She is allergic to doxycycline, erythromycin, sulfa antibiotics, and sulfamethoxazole-trimethoprim.       Review of Systems:  Review of Systems  Constitutional: Denied constitutional symptoms, night  sweats, recent illness, fatigue, fever, insomnia and weight loss.  Eyes: Denied eye symptoms, eye pain, photophobia, vision change and visual disturbance.  Ears/Nose/Throat/Neck: Denied ear, nose, throat or neck symptoms, hearing loss, nasal discharge, sinus congestion and sore throat.  Cardiovascular: Denied cardiovascular symptoms, arrhythmia, chest pain/pressure, edema, exercise intolerance, orthopnea and palpitations.  Respiratory: Denied pulmonary symptoms, asthma, pleuritic pain, productive sputum, cough, dyspnea and wheezing.  Gastrointestinal: Denied, gastro-esophageal reflux, melena, nausea and vomiting.  Genitourinary: See HPI for additional information.  Musculoskeletal: Denied musculoskeletal symptoms, stiffness, swelling, muscle weakness and myalgia.  Dermatologic: Denied dermatology symptoms, rash and scar.  Neurologic: Denied neurology symptoms, dizziness, headache, neck pain and syncope.  Psychiatric: Denied psychiatric symptoms, anxiety and depression.  Endocrine: Denied endocrine symptoms including hot flashes and night sweats.   Meds:   Current Outpatient Medications on File Prior to Visit  Medication Sig Dispense Refill   Calcium Carbonate-Vit D-Min (CALCIUM 1200 PO) Take by mouth.     ergocalciferol (VITAMIN D2) 1.25 MG (50000 UT) capsule Take 50,000 Units by mouth once a week.     ibuprofen (ADVIL,MOTRIN) 800 MG tablet Take 1 tablet (800 mg total) by mouth every 8 (eight) hours as needed for mild pain or moderate pain. 30 tablet 0   Melatonin-Pyridoxine (MELATIN PO) Take by mouth.     Multiple Vitamin (MULTIVITAMIN) capsule Take 1 capsule by mouth daily.     polyethylene glycol (MIRALAX / GLYCOLAX) 17 g packet Take 17 g by mouth daily.     traZODone (DESYREL) 50 MG tablet Take 50 mg by mouth at bedtime as needed.     No current facility-administered medications on file prior to visit.  Objective:     Vitals:   04/19/22 1413  BP: 105/70  Pulse: 90    Filed  Weights   04/19/22 1413  Weight: 148 lb (67.1 kg)              Physical examination   Pelvic:   Vulva: Normal appearance.  Small left labial sebaceous cyst  Vagina: No lesions or abnormalities noted.  Support: Normal pelvic support.  Urethra No masses tenderness or scarring.  Meatus Normal size without lesions or prolapse.  Cervix: Surgically absent  Anus: Normal exam.  No lesions.  Perineum: Normal exam.  No lesions.     Assessment:    G2P2002 Patient Active Problem List   Diagnosis Date Noted   Diverticulitis 11/30/2020   Axillary mass 08/21/2013     1. Well woman exam with routine gynecological exam   2. Vaginal Pap smear   3. Menopausal symptoms   4. Labial cyst   5. High risk human papilloma virus (HPV) infection of cervix     Sebaceous cyst  History of abnormal Pap of the vaginal cuff.  Positive HPV   Plan:            1.  Basic Screening Recommendations The basic screening recommendations for asymptomatic women were discussed with the patient during her visit.  The age-appropriate recommendations were discussed with her and the rational for the tests reviewed.  When I am informed by the patient that another primary care physician has previously obtained the age-appropriate tests and they are up-to-date, only outstanding tests are ordered and referrals given as necessary.  Abnormal results of tests will be discussed with her when all of her results are completed.  Routine preventative health maintenance measures emphasized: Exercise/Diet/Weight control, Tobacco Warnings, Alcohol/Substance use risks and Stress Management 2.  Advised to discontinue smoking 3.  Patient would like to return for sebaceous cyst removal/drainage 4   Pap of the vaginal cuff performed with HPV typing. Orders No orders of the defined types were placed in this encounter.   No orders of the defined types were placed in this encounter.         F/U  Return in about 2 weeks (around  05/03/2022).  Elonda Husky, M.D. 04/19/2022 2:50 PM

## 2022-04-19 NOTE — Progress Notes (Signed)
Patients presents for annual exam today. She states complaints of a labial cyst on the left side, states she is prone to cysts in the past. Reports ongoing menopausal symptoms for years, continues to be a daily smoker. Patient is due for vaginal pap smear, ordered. Mammogram is up to date. Annual labs are deferred to PCP.

## 2022-04-25 LAB — CYTOLOGY - PAP
Comment: NEGATIVE
High risk HPV: NEGATIVE

## 2022-05-05 ENCOUNTER — Encounter: Payer: Self-pay | Admitting: Obstetrics and Gynecology

## 2022-05-05 ENCOUNTER — Ambulatory Visit: Payer: BC Managed Care – PPO | Admitting: Obstetrics and Gynecology

## 2022-05-05 VITALS — BP 95/61 | HR 66 | Ht 65.0 in | Wt 149.3 lb

## 2022-05-05 DIAGNOSIS — G4733 Obstructive sleep apnea (adult) (pediatric): Secondary | ICD-10-CM | POA: Diagnosis not present

## 2022-05-05 DIAGNOSIS — L723 Sebaceous cyst: Secondary | ICD-10-CM | POA: Diagnosis not present

## 2022-05-05 NOTE — Progress Notes (Signed)
HPI:      [name] is a 57 y.o. [phone] who LMP was No LMP recorded. Patient has had a hysterectomy.  Subjective:   She presents today for management of sebaceous vulvar cyst.  It is annoying to her and gets in the way and often becomes irritated with her underwear.  She would like it removed.    Hx: The following portions of the patient's history were reviewed and updated as appropriate:             She  has a past medical history of Complication of anesthesia, Diverticulitis, GERD (gastroesophageal reflux disease), Headache, History of kidney stones, Low blood pressure, Pituitary mass (HCC), Seizure (HCC) (2006), and TIA (transient ischemic attack) (2006). She does not have any pertinent problems on file. She  has a past surgical history that includes Bladder suspension (2005 ?); Abdominal hysterectomy (1997); Kidney surgery; Partial hysterectomy; Diagnostic laparoscopy; Colonoscopy; Esophagogastroduodenoscopy; and Cholecystectomy (N/A, 08/29/2017). Her family history includes Cancer in her father; Diabetes in her father; Hypertension in her father and mother. She  reports that she has been smoking cigarettes. She has a 5.00 pack-year smoking history. She has never used smokeless tobacco. She reports that she does not drink alcohol and does not use drugs. She has a current medication list which includes the following prescription(s): calcium carbonate-vit d-min, ergocalciferol, ibuprofen, melatonin-pyridoxine, multivitamin, polyethylene glycol, and trazodone. She is allergic to doxycycline, erythromycin, sulfa antibiotics, and sulfamethoxazole-trimethoprim.       Review of Systems:  Review of Systems  Constitutional: Denied constitutional symptoms, night sweats, recent illness, fatigue, fever, insomnia and weight loss.  Eyes: Denied eye symptoms, eye pain, photophobia, vision change and visual disturbance.  Ears/Nose/Throat/Neck: Denied ear, nose, throat or neck symptoms,  hearing loss, nasal discharge, sinus congestion and sore throat.  Cardiovascular: Denied cardiovascular symptoms, arrhythmia, chest pain/pressure, edema, exercise intolerance, orthopnea and palpitations.  Respiratory: Denied pulmonary symptoms, asthma, pleuritic pain, productive sputum, cough, dyspnea and wheezing.  Gastrointestinal: Denied, gastro-esophageal reflux, melena, nausea and vomiting.  Genitourinary: See HPI for additional information.  Musculoskeletal: Denied musculoskeletal symptoms, stiffness, swelling, muscle weakness and myalgia.  Dermatologic: Denied dermatology symptoms, rash and scar.  Neurologic: Denied neurology symptoms, dizziness, headache, neck pain and syncope.  Psychiatric: Denied psychiatric symptoms, anxiety and depression.  Endocrine: Denied endocrine symptoms including hot flashes and night sweats.   Meds:   Current Outpatient Medications on File Prior to Visit  Medication Sig Dispense Refill   Calcium Carbonate-Vit D-Min (CALCIUM 1200 PO) Take by mouth.     ergocalciferol (VITAMIN D2) 1.25 MG (50000 UT) capsule Take 50,000 Units by mouth once a week.     ibuprofen (ADVIL,MOTRIN) 800 MG tablet Take 1 tablet (800 mg total) by mouth every 8 (eight) hours as needed for mild pain or moderate pain. 30 tablet 0   Melatonin-Pyridoxine (MELATIN PO) Take by mouth.     Multiple Vitamin (MULTIVITAMIN) capsule Take 1 capsule by mouth daily.     polyethylene glycol (MIRALAX / GLYCOLAX) 17 g packet Take 17 g by mouth daily.     traZODone (DESYREL) 50 MG tablet Take 50 mg by mouth at bedtime as needed.     No current facility-administered medications on file prior to visit.      Objective:     Vitals:   05/05/22 0820  BP: 95/61  Pulse: 66   Filed Weights   05/05/22 0820  Weight: 149 lb 4.8 oz (67.7 kg)  Left labial vulvar cyst consistent with sebaceous cyst   Procedure:    Area cleansed with Betadine    Injected lidocaine with epi    Small  incision made with scalpel overlying cystic area    Entire contents of cyst removed    Hemostasis obtained with silver nitrate            Assessment:    G2P2002 Patient Active Problem List   Diagnosis Date Noted   Diverticulitis 11/30/2020   Axillary mass 08/21/2013     1. Sebaceous cyst     Cyst removed   Plan:            1.  Patient to follow-up for annual examination.  I do not expect the cyst to return but if it does we will do more of a marsupialization technique. Orders No orders of the defined types were placed in this encounter.   No orders of the defined types were placed in this encounter.     F/U  Return for Annual Physical. I spent 25 minutes involved in the care of this patient preparing to see the patient by obtaining and reviewing her medical history (including labs, imaging tests and prior procedures), documenting clinical information in the electronic health record (EHR), counseling and coordinating care plans, writing and sending prescriptions, ordering tests or procedures and in direct communicating with the patient and medical staff discussing pertinent items from her history and physical exam.  Elonda Husky, M.D. 05/05/2022 8:55 AM

## 2022-05-05 NOTE — Progress Notes (Signed)
Patient presents today for a cyst removal. She states no questions or concerns at this time.

## 2022-05-06 ENCOUNTER — Encounter: Payer: Self-pay | Admitting: Obstetrics and Gynecology

## 2022-06-29 ENCOUNTER — Ambulatory Visit
Admission: EM | Admit: 2022-06-29 | Discharge: 2022-06-29 | Disposition: A | Discharge status: Home / Self Care | Payer: BC Managed Care – PPO | Attending: Family Medicine | Admitting: Family Medicine

## 2022-06-29 DIAGNOSIS — S91312A Laceration without foreign body, left foot, initial encounter: Secondary | ICD-10-CM

## 2022-06-29 MED ORDER — CLINDAMYCIN HCL 300 MG PO CAPS
300.0000 mg | ORAL_CAPSULE | Freq: Four times a day (QID) | ORAL | 0 refills | Status: AC
Start: 2022-06-29 — End: 2022-07-06

## 2022-06-29 MED ORDER — FLUCONAZOLE 150 MG PO TABS
150.0000 mg | ORAL_TABLET | ORAL | 0 refills | Status: AC
Start: 2022-06-29 — End: 2022-07-03

## 2022-06-29 NOTE — ED Provider Notes (Signed)
[facility]    CSN: [id number] Arrival date & time: 06/29/22  1336      History   Chief Complaint Chief Complaint  Patient presents with   Laceration    HPI Linda Walker is a 57 y.o. female.   HPI  Oluwadarasimi presents for left foot laceration that occurred on Sunday after pressure washing her house. She swiped the top of her foot with the nozzle.  He felt immediate pain and noticed skin missing.  She has been having redness and swelling in her foot that goes up her leg.  He tried cleaning it but noticed that the pain kept getting worse so she came to the urgent care today.   Past Medical History:  Diagnosis Date   Complication of anesthesia    HARD TO WAKE UP   Diverticulitis    GERD (gastroesophageal reflux disease)    Headache    History of kidney stones    Low blood pressure    90/60 NORMAL FOR PT   Pituitary mass (HCC)    PT STATES THEY NEVER FIGURED OUT IF IT WAS A MASS OR A CYST   Seizure (HCC) 2006   RELATED TO PITUITARY MASS/CYST   TIA (transient ischemic attack) 2006   [facility] WAS THINKING PT WAS HAVING TIA'S FROM POSSIBLE PITUITARY MASS    Patient Active Problem List   Diagnosis Date Noted   Diverticulitis 11/30/2020   Axillary mass 08/21/2013    Past Surgical History:  Procedure Laterality Date   ABDOMINAL HYSTERECTOMY  1997   BLADDER SUSPENSION  2005 ?   CHOLECYSTECTOMY N/A 08/29/2017   Procedure: LAPAROSCOPIC CHOLECYSTECTOMY;  Surgeon: Sung Amabile, DO;  Location: [facility] ORS;  Service: General;  Laterality: N/A;   COLONOSCOPY     DIAGNOSTIC LAPAROSCOPY     MULTIPLE   ESOPHAGOGASTRODUODENOSCOPY     KIDNEY SURGERY     REFLUX   PARTIAL HYSTERECTOMY      OB History     Gravida  2   Para  2   Term  2   Preterm      AB      Living  2      SAB      IAB      Ectopic      Multiple      Live Births  2        Obstetric Comments  1st Menstrual Cycle:  12  1st Pregnancy:  24          Home Medications     Prior to Admission medications   Medication Sig Start Date End Date Taking? Authorizing Provider  clindamycin (CLEOCIN) 300 MG capsule Take 1 capsule (300 mg total) by mouth 4 (four) times daily for 7 days. 06/29/22 07/06/22 Yes Abednego Yeates, DO  fluconazole (DIFLUCAN) 150 MG tablet Take 1 tablet (150 mg total) by mouth every 3 (three) days for 2 doses. 06/29/22 07/03/22 Yes Zedrick Springsteen, DO  Calcium Carbonate-Vit D-Min (CALCIUM 1200 PO) Take by mouth.    [provider]  ergocalciferol (VITAMIN D2) 1.25 MG (50000 UT) capsule Take 50,000 Units by mouth once a week.    [provider]  ibuprofen (ADVIL,MOTRIN) 800 MG tablet Take 1 tablet (800 mg total) by mouth every 8 (eight) hours as needed for mild pain or moderate pain. 08/29/17   Tonna Boehringer, Isami, DO  Melatonin-Pyridoxine (MELATIN PO) Take by mouth.    [provider]  Multiple Vitamin (MULTIVITAMIN) capsule Take 1 capsule by  mouth daily.    [provider]  polyethylene glycol (MIRALAX / GLYCOLAX) 17 g packet Take 17 g by mouth daily.    [provider]  traZODone (DESYREL) 50 MG tablet Take 50 mg by mouth at bedtime as needed. 10/28/20   [provider]    Family History Family History  Problem Relation Age of Onset   Hypertension Mother    Cancer Father    Diabetes Father    Hypertension Father     Social History Social History   Tobacco Use   Smoking status: Every Day    Packs/day: 0.25    Years: 20.00    Additional pack years: 0.00    Total pack years: 5.00    Types: Cigarettes   Smokeless tobacco: Never  Vaping Use   Vaping Use: Never used  Substance Use Topics   Alcohol use: No   Drug use: No     Allergies   Doxycycline, Erythromycin, Sulfa antibiotics, and Sulfamethoxazole-trimethoprim   Review of Systems Review of Systems :negative unless otherwise stated in HPI.      Physical Exam Triage Vital Signs ED Triage Vitals [06/29/22 1435]  Enc Vitals  Group     BP      Pulse      Resp      Temp      Temp src      SpO2      Weight      Height      Head Circumference      Peak Flow      Pain Score 0     Pain Loc      Pain Edu?      Excl. in GC?    No data found.  Updated Vital Signs BP 107/72 (BP Location: Left Arm)   Pulse 80   Temp 98.4 F (36.9 C) (Oral)   Resp 17   SpO2 99%   Visual Acuity Right Eye Distance:   Left Eye Distance:   Bilateral Distance:    Right Eye Near:   Left Eye Near:    Bilateral Near:     Physical Exam  GEN: alert, well appearing female, in no acute distress  EYES: extra occular movements intact, no scleral injection CV: regular rate strong DP pulses RESP: no increased work of breathing MSK: Range of motion left ankle and foot injuries, mild edema with erythema streaking up the ankle NEURO: alert, oriented, moves all extremities appropriately, antalgic gait  SKIN: erythematous skin avulsion with granulation tissue, there is warm but no discharge  UC Treatments / Results  Labs (all labs ordered are listed, but only abnormal results are displayed) Labs Reviewed - No data to display  EKG   Radiology No results found.   Procedures Procedures (including critical care time)  Medications Ordered in UC Medications - No data to display  Initial Impression / Assessment and Plan / UC Course  I have reviewed the triage vital signs and the nursing notes.  Pertinent labs & imaging results that were available during my care of the patient were reviewed by me and considered in my medical decision making (see chart for details).     Patient is a 57 y.o. female who presents for foot injury .  Overall, patient is well-appearing and well-hydrated.  Vital signs stable.  Linda is afebrile.  Exam concerning for laceration. Wound healing by secondary intention. Treat with antibiotics as below.  Diflucan given for yeast infection prevention.  Tetanus last  given 04/02/16, per chart.    Reviewed expectations regarding course of current medical issues.  All questions asked were answered.  Outlined signs and symptoms indicating need for more acute intervention. Patient verbalized understanding. After Visit Summary given.   Final Clinical Impressions(s) / UC Diagnoses   Final diagnoses:  Laceration of left foot, initial encounter     Discharge Instructions      Take your antibiotics as prescribed. If pain or swelling worsens go to the emergency department. Be sure to take probiotics or eat yogurt while taking antbiotics. Take tylenol or Motrin as needed for pain.      ED Prescriptions     Medication Sig Dispense Auth. Provider   clindamycin (CLEOCIN) 300 MG capsule Take 1 capsule (300 mg total) by mouth 4 (four) times daily for 7 days. 28 capsule Dasja Brase, DO   fluconazole (DIFLUCAN) 150 MG tablet Take 1 tablet (150 mg total) by mouth every 3 (three) days for 2 doses. 2 tablet Katha Cabal, DO      PDMP not reviewed this encounter.              Katha Cabal, DO 07/03/22 [phone]

## 2022-06-29 NOTE — ED Triage Notes (Addendum)
Pt presents with a laceration to the Lt foot. Pt states she was pressure washing her house and swiped the nozzle against the top of her foot, taking piece of the skin off. Pt states this happened on Sunday.

## 2022-06-29 NOTE — Discharge Instructions (Addendum)
Take your antibiotics as prescribed. If pain or swelling worsens go to the emergency department. Be sure to take probiotics or eat yogurt while taking antbiotics. Take tylenol or Motrin as needed for pain.

## 2022-07-22 ENCOUNTER — Other Ambulatory Visit: Payer: Self-pay | Admitting: Internal Medicine

## 2022-07-22 ENCOUNTER — Ambulatory Visit
Admission: RE | Admit: 2022-07-22 | Discharge: 2022-07-22 | Disposition: A | Discharge status: Home / Self Care | Payer: BC Managed Care – PPO | Source: Ambulatory Visit | Attending: Internal Medicine | Admitting: Internal Medicine

## 2022-07-22 DIAGNOSIS — M79662 Pain in left lower leg: Secondary | ICD-10-CM | POA: Diagnosis not present

## 2022-07-22 DIAGNOSIS — M7989 Other specified soft tissue disorders: Secondary | ICD-10-CM | POA: Diagnosis present

## 2022-10-11 ENCOUNTER — Other Ambulatory Visit: Payer: Self-pay | Admitting: Gastroenterology

## 2022-10-11 DIAGNOSIS — R1013 Epigastric pain: Secondary | ICD-10-CM

## 2022-10-11 DIAGNOSIS — R1031 Right lower quadrant pain: Secondary | ICD-10-CM

## 2022-10-11 DIAGNOSIS — R1011 Right upper quadrant pain: Secondary | ICD-10-CM

## 2022-10-21 ENCOUNTER — Ambulatory Visit
Admission: RE | Admit: 2022-10-21 | Discharge: 2022-10-21 | Disposition: A | Discharge status: Home / Self Care | Payer: BC Managed Care – PPO | Source: Ambulatory Visit | Attending: Gastroenterology | Admitting: Gastroenterology

## 2022-10-21 DIAGNOSIS — R1031 Right lower quadrant pain: Secondary | ICD-10-CM | POA: Insufficient documentation

## 2022-10-21 DIAGNOSIS — R1011 Right upper quadrant pain: Secondary | ICD-10-CM | POA: Diagnosis present

## 2022-10-21 DIAGNOSIS — R1013 Epigastric pain: Secondary | ICD-10-CM | POA: Insufficient documentation

## 2022-10-21 MED ORDER — IOHEXOL 300 MG/ML  SOLN
100.0000 mL | Freq: Once | INTRAMUSCULAR | Status: AC | PRN
  Administered 2022-10-21: 100 mL via INTRAVENOUS

## 2022-11-28 ENCOUNTER — Encounter: Payer: Self-pay | Admitting: *Deleted

## 2022-12-26 ENCOUNTER — Encounter: Payer: Self-pay | Admitting: *Deleted

## 2022-12-30 ENCOUNTER — Encounter
Admission: RE | Disposition: A | Discharge status: Home / Self Care | Payer: Self-pay | Source: Home / Self Care | Attending: Gastroenterology

## 2022-12-30 ENCOUNTER — Ambulatory Visit: Payer: BC Managed Care – PPO | Admitting: Anesthesiology

## 2022-12-30 ENCOUNTER — Encounter: Payer: Self-pay | Admitting: *Deleted

## 2022-12-30 ENCOUNTER — Ambulatory Visit
Admission: RE | Admit: 2022-12-30 | Discharge: 2022-12-30 | Disposition: A | Discharge status: Home / Self Care | Payer: BC Managed Care – PPO | Attending: Gastroenterology | Admitting: Gastroenterology

## 2022-12-30 DIAGNOSIS — K449 Diaphragmatic hernia without obstruction or gangrene: Secondary | ICD-10-CM | POA: Insufficient documentation

## 2022-12-30 DIAGNOSIS — K640 First degree hemorrhoids: Secondary | ICD-10-CM | POA: Insufficient documentation

## 2022-12-30 DIAGNOSIS — K5730 Diverticulosis of large intestine without perforation or abscess without bleeding: Secondary | ICD-10-CM | POA: Diagnosis not present

## 2022-12-30 DIAGNOSIS — R1033 Periumbilical pain: Secondary | ICD-10-CM | POA: Diagnosis present

## 2022-12-30 DIAGNOSIS — Z90710 Acquired absence of both cervix and uterus: Secondary | ICD-10-CM | POA: Diagnosis not present

## 2022-12-30 DIAGNOSIS — J449 Chronic obstructive pulmonary disease, unspecified: Secondary | ICD-10-CM | POA: Diagnosis not present

## 2022-12-30 DIAGNOSIS — K5900 Constipation, unspecified: Secondary | ICD-10-CM | POA: Insufficient documentation

## 2022-12-30 DIAGNOSIS — K3189 Other diseases of stomach and duodenum: Secondary | ICD-10-CM | POA: Diagnosis not present

## 2022-12-30 DIAGNOSIS — Z9049 Acquired absence of other specified parts of digestive tract: Secondary | ICD-10-CM | POA: Insufficient documentation

## 2022-12-30 DIAGNOSIS — D123 Benign neoplasm of transverse colon: Secondary | ICD-10-CM | POA: Insufficient documentation

## 2022-12-30 DIAGNOSIS — R1013 Epigastric pain: Secondary | ICD-10-CM | POA: Diagnosis not present

## 2022-12-30 HISTORY — PX: BIOPSY: SHX5522

## 2022-12-30 HISTORY — PX: POLYPECTOMY: SHX5525

## 2022-12-30 HISTORY — PX: COLONOSCOPY WITH PROPOFOL: SHX5780

## 2022-12-30 HISTORY — PX: ESOPHAGOGASTRODUODENOSCOPY (EGD) WITH PROPOFOL: SHX5813

## 2022-12-30 SURGERY — COLONOSCOPY WITH PROPOFOL
Anesthesia: General

## 2022-12-30 MED ORDER — GLYCOPYRROLATE 0.2 MG/ML IJ SOLN
INTRAMUSCULAR | Status: DC | PRN
  Administered 2022-12-30: .2 mg via INTRAVENOUS

## 2022-12-30 MED ORDER — PHENYLEPHRINE 80 MCG/ML (10ML) SYRINGE FOR IV PUSH (FOR BLOOD PRESSURE SUPPORT)
PREFILLED_SYRINGE | INTRAVENOUS | Status: AC
Start: 2022-12-30 — End: ?
  Filled 2022-12-30: qty 10

## 2022-12-30 MED ORDER — LIDOCAINE HCL (PF) 2 % IJ SOLN
INTRAMUSCULAR | Status: AC
  Filled 2022-12-30: qty 5

## 2022-12-30 MED ORDER — PROPOFOL 1000 MG/100ML IV EMUL
INTRAVENOUS | Status: AC
  Filled 2022-12-30: qty 100

## 2022-12-30 MED ORDER — GLYCOPYRROLATE 0.2 MG/ML IJ SOLN
INTRAMUSCULAR | Status: AC
  Filled 2022-12-30: qty 1

## 2022-12-30 MED ORDER — LIDOCAINE HCL (CARDIAC) PF 100 MG/5ML IV SOSY
PREFILLED_SYRINGE | INTRAVENOUS | Status: DC | PRN
  Administered 2022-12-30: 100 mg via INTRAVENOUS

## 2022-12-30 MED ORDER — PHENYLEPHRINE HCL (PRESSORS) 10 MG/ML IV SOLN
INTRAVENOUS | Status: DC | PRN
  Administered 2022-12-30: 80 ug via INTRAVENOUS
  Administered 2022-12-30: 120 ug via INTRAVENOUS

## 2022-12-30 MED ORDER — PROPOFOL 10 MG/ML IV BOLUS
INTRAVENOUS | Status: DC | PRN
  Administered 2022-12-30: 50 mg via INTRAVENOUS
  Administered 2022-12-30: 20 mg via INTRAVENOUS
  Administered 2022-12-30: 10 mg via INTRAVENOUS
  Administered 2022-12-30: 60 mg via INTRAVENOUS
  Administered 2022-12-30: 90 mg via INTRAVENOUS

## 2022-12-30 MED ORDER — PROPOFOL 500 MG/50ML IV EMUL
INTRAVENOUS | Status: DC | PRN
  Administered 2022-12-30: 125 ug/kg/min via INTRAVENOUS

## 2022-12-30 MED ORDER — SODIUM CHLORIDE 0.9 % IV SOLN: INTRAVENOUS | Status: DC

## 2022-12-30 NOTE — Op Note (Signed)
[facility] Gastroenterology Patient Name: Linda Walker Procedure Date: 12/30/2022 10:03 AM MRN: [id number] Account #: [id number] Date of Birth: [birth year] Admit Type: Outpatient Age: 57 Room: [facility] ENDO ROOM 3 Gender: Female Note Status: Finalized Instrument Name: Upper Endoscope [id number] Procedure:             Upper GI endoscopy Indications:           Dyspepsia Providers:             Eather Colas MD, MD Referring MD:          Sherrie Mustache, MD (Referring MD) Medicines:             Monitored Anesthesia Care Complications:         No immediate complications. Estimated blood loss:                         Minimal. Procedure:             Pre-Anesthesia Assessment:                        - Prior to the procedure, a History and Physical was                         performed, and patient medications and allergies were                         reviewed. The patient is competent. The risks and                         benefits of the procedure and the sedation options and                         risks were discussed with the patient. All questions                         were answered and informed consent was obtained.                         Patient identification and proposed procedure were                         verified by the physician, the nurse, the                         anesthesiologist, the anesthetist and the technician                         in the endoscopy suite. Mental Status Examination:                         alert and oriented. Airway Examination: normal                         oropharyngeal airway and neck mobility. Respiratory                         Examination: clear to auscultation. CV Examination:  normal. Prophylactic Antibiotics: The patient does not                         require prophylactic antibiotics. Prior                         Anticoagulants: The patient has taken no anticoagulant                          or antiplatelet agents. ASA Grade Assessment: III - A                         patient with severe systemic disease. After reviewing                         the risks and benefits, the patient was deemed in                         satisfactory condition to undergo the procedure. The                         anesthesia plan was to use monitored anesthesia care                         (MAC). Immediately prior to administration of                         medications, the patient was re-assessed for adequacy                         to receive sedatives. The heart rate, respiratory                         rate, oxygen saturations, blood pressure, adequacy of                         pulmonary ventilation, and response to care were                         monitored throughout the procedure. The physical                         status of the patient was re-assessed after the                         procedure.                        After obtaining informed consent, the endoscope was                         passed under direct vision. Throughout the procedure,                         the patient's blood pressure, pulse, and oxygen                         saturations were monitored continuously. The  Endosonoscope was introduced through the mouth, and                         advanced to the second part of duodenum. The upper GI                         endoscopy was accomplished without difficulty. The                         patient tolerated the procedure well. Findings:      A small hiatal hernia was present.      The exam of the esophagus was otherwise normal.      A few dispersed diminutive erosions with no bleeding and no stigmata of       recent bleeding were found in the gastric body. Biopsies were taken with       a cold forceps for Helicobacter pylori testing. Estimated blood loss was       minimal.      The examined duodenum was normal. Impression:            -  Small hiatal hernia.                        - Erosive gastropathy with no bleeding and no stigmata                         of recent bleeding. Biopsied.                        - Normal examined duodenum. Recommendation:        - Discharge patient to home.                        - Resume previous diet.                        - Continue present medications.                        - Await pathology results.                        - Return to referring physician as previously                         scheduled. Procedure Code(s):     --- Professional ---                        [phone], Esophagogastroduodenoscopy, flexible,                         transoral; with biopsy, single or multiple Diagnosis Code(s):     --- Professional ---                        K44.9, Diaphragmatic hernia without obstruction or                         gangrene                        K31.89, Other  diseases of stomach and duodenum                        R10.13, Epigastric pain CPT copyright 2022 [employer]. All rights reserved. The codes documented in this report are preliminary and upon coder review may  be revised to meet current compliance requirements. Eather Colas MD, MD 12/30/2022 10:55:17 AM Number of Addenda: 0 Note Initiated On: 12/30/2022 10:03 AM Estimated Blood Loss:  Estimated blood loss was minimal.      [facility]

## 2022-12-30 NOTE — Anesthesia Postprocedure Evaluation (Signed)
Anesthesia Post Note  Patient: Linda Walker  Procedure(s) Performed: COLONOSCOPY WITH PROPOFOL ESOPHAGOGASTRODUODENOSCOPY (EGD) WITH PROPOFOL BIOPSY POLYPECTOMY  Patient location during evaluation: PACU Anesthesia Type: General Level of consciousness: awake Pain management: satisfactory to patient Vital Signs Assessment: post-procedure vital signs reviewed and stable Respiratory status: spontaneous breathing and nonlabored ventilation Cardiovascular status: stable Anesthetic complications: no   No notable events documented.   Last Vitals:  Vitals:   12/30/22 1050 12/30/22 1057  BP:  (!) 117/57  Pulse:    Resp:    Temp: (!) 36.1 C   SpO2:      Last Pain:  Vitals:   12/30/22 1057  TempSrc:   PainSc: 0-No pain                 VAN STAVEREN,Shery [place]

## 2022-12-30 NOTE — Op Note (Signed)
[facility] Patient Name: Linda Walker Procedure Date: 12/30/2022 10:03 AM MRN: [id number] Account #: [id number] Date of Birth: [birth year] Admit Type: Outpatient Age: 57 Room: [facility] ENDO ROOM 3 Gender: Female Note Status: Finalized Instrument Name: Prentice Docker [id number] Procedure:             Colonoscopy Indications:           Periumbilical abdominal pain Providers:             Eather Colas MD, MD Referring MD:          Sherrie Mustache, MD (Referring MD) Medicines:             Monitored Anesthesia Care Complications:         No immediate complications. Estimated blood loss:                         Minimal. Procedure:             Pre-Anesthesia Assessment:                        - Prior to the procedure, a History and Physical was                         performed, and patient medications and allergies were                         reviewed. The patient is competent. The risks and                         benefits of the procedure and the sedation options and                         risks were discussed with the patient. All questions                         were answered and informed consent was obtained.                         Patient identification and proposed procedure were                         verified by the physician, the nurse, the                         anesthesiologist, the anesthetist and the technician                         in the endoscopy suite. Mental Status Examination:                         alert and oriented. Airway Examination: normal                         oropharyngeal airway and neck mobility. Respiratory                         Examination: clear to auscultation. CV Examination:  normal. Prophylactic Antibiotics: The patient does not                         require prophylactic antibiotics. Prior                         Anticoagulants: The patient has taken no anticoagulant                          or antiplatelet agents. ASA Grade Assessment: III - A                         patient with severe systemic disease. After reviewing                         the risks and benefits, the patient was deemed in                         satisfactory condition to undergo the procedure. The                         anesthesia plan was to use monitored anesthesia care                         (MAC). Immediately prior to administration of                         medications, the patient was re-assessed for adequacy                         to receive sedatives. The heart rate, respiratory                         rate, oxygen saturations, blood pressure, adequacy of                         pulmonary ventilation, and response to care were                         monitored throughout the procedure. The physical                         status of the patient was re-assessed after the                         procedure.                        After obtaining informed consent, the colonoscope was                         passed under direct vision. Throughout the procedure,                         the patient's blood pressure, pulse, and oxygen                         saturations were monitored continuously. The  Colonoscope was introduced through the anus and                         advanced to the the terminal ileum, with                         identification of the appendiceal orifice and IC                         valve. The colonoscopy was performed without                         difficulty. The patient tolerated the procedure well.                         The quality of the bowel preparation was good. The                         terminal ileum, ileocecal valve, appendiceal orifice,                         and rectum were photographed. Findings:      The perianal and digital rectal examinations were normal.      The terminal ileum appeared normal.      A 3 mm polyp was  found in the hepatic flexure. The polyp was sessile.       The polyp was removed with a cold snare. Resection and retrieval were       complete. Estimated blood loss was minimal.      A 2 mm polyp was found in the transverse colon. The polyp was sessile.       The polyp was removed with a cold snare. Resection and retrieval were       complete. Estimated blood loss was minimal.      A few small-mouthed diverticula were found in the sigmoid colon and       hepatic flexure.      Internal hemorrhoids were found during retroflexion. The hemorrhoids       were Grade I (internal hemorrhoids that do not prolapse).      The exam was otherwise without abnormality on direct and retroflexion       views. Impression:            - The examined portion of the ileum was normal.                        - One 3 mm polyp at the hepatic flexure, removed with                         a cold snare. Resected and retrieved.                        - One 2 mm polyp in the transverse colon, removed with                         a cold snare. Resected and retrieved.                        - Diverticulosis in the sigmoid colon and at the  hepatic flexure.                        - Internal hemorrhoids.                        - The examination was otherwise normal on direct and                         retroflexion views. Recommendation:        - Discharge patient to home.                        - Resume previous diet.                        - Continue present medications.                        - Await pathology results.                        - Repeat colonoscopy in 7-10 years for surveillance.                        - Return to referring physician as previously                         scheduled. Procedure Code(s):     --- Professional ---                        [phone], Colonoscopy, flexible; with removal of                         tumor(s), polyp(s), or other lesion(s) by snare                          technique Diagnosis Code(s):     --- Professional ---                        K64.0, First degree hemorrhoids                        D12.3, Benign neoplasm of transverse colon (hepatic                         flexure or splenic flexure)                        R10.33, Periumbilical pain                        K57.30, Diverticulosis of large intestine without                         perforation or abscess without bleeding CPT copyright 2022 [employer]. All rights reserved. The codes documented in this report are preliminary and upon coder review may  be revised to meet current compliance requirements. Eather Colas MD, MD 12/30/2022 10:59:14 AM Number of Addenda: 0 Note Initiated On: 12/30/2022 10:03 AM Scope Withdrawal Time: 0 hours 11 minutes 3 seconds  Total Procedure Duration: 0  hours 14 minutes 54 seconds  Estimated Blood Loss:  Estimated blood loss was minimal.      [facility]

## 2022-12-30 NOTE — H&P (Signed)
Outpatient short stay form Pre-procedure 12/30/2022  Regis Bill, MD  Primary Physician: Sherrie Mustache, MD  Reason for visit:  Dyspepsia/Abdominal pain  History of present illness:    57 y/o lady with history of GERD, constipation, and tobacco abuse here for EGD/Colonoscopy for dyspepsia and abdominal pain. Last colonoscopy was about 5 years ago and was unremarkable. No blood thinners. No family history of GI malignancies. History of cholecystectomy and hysterectomy.    Current Facility-Administered Medications:    0.9 %  sodium chloride infusion, , Intravenous, Continuous, Thetis Schwimmer, Rossie Muskrat, MD, Last Rate: 20 mL/hr at 12/30/22 0930, New Bag at 12/30/22 0930  Medications Prior to Admission  Medication Sig Dispense Refill Last Dose/Taking   Calcium Carbonate-Vit D-Min (CALCIUM 1200 PO) Take by mouth.   Past Week   ergocalciferol (VITAMIN D2) 1.25 MG (50000 UT) capsule Take 50,000 Units by mouth once a week.   Past Week   ibuprofen (ADVIL,MOTRIN) 800 MG tablet Take 1 tablet (800 mg total) by mouth every 8 (eight) hours as needed for mild pain or moderate pain. 30 tablet 0 Past Month   Multiple Vitamin (MULTIVITAMIN) capsule Take 1 capsule by mouth daily.   Past Week   Melatonin-Pyridoxine (MELATIN PO) Take by mouth.      polyethylene glycol (MIRALAX / GLYCOLAX) 17 g packet Take 17 g by mouth daily.      traZODone (DESYREL) 50 MG tablet Take 50 mg by mouth at bedtime as needed.        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    Physical Exam  Gen: Alert, oriented. Appears stated age.  HEENT: PERRLA. Lungs: No respiratory distress CV: RRR Abd: soft, benign, no masses Ext: No edema    Planned procedures: Proceed with EGD/colonoscopy. The patient understands the nature of the planned procedure, indications, risks, alternatives and potential complications including but not limited to bleeding, infection, perforation, damage to internal organs and possible oversedation/side effects from anesthesia. The patient agrees and gives consent to proceed.  Please refer to procedure notes for findings, recommendations and patient disposition/instructions.     Regis Bill, MD [facility]

## 2022-12-30 NOTE — Transfer of Care (Signed)
Immediate Anesthesia Transfer of Care Note  Patient: Linda Walker  Procedure(s) Performed: COLONOSCOPY WITH PROPOFOL ESOPHAGOGASTRODUODENOSCOPY (EGD) WITH PROPOFOL BIOPSY POLYPECTOMY  Patient Location: PACU  Anesthesia Type:General  Level of Consciousness: drowsy  Airway & Oxygen Therapy: Patient Spontanous Breathing  Post-op Assessment: Report given to RN and Post -op Vital signs reviewed and stable  Post vital signs: Reviewed and stable  Last Vitals:  Vitals Value Taken Time  BP 95/56 12/30/22 1050  Temp 97.5  12/30/22   1050  Pulse 66 12/30/22 1050  Resp 15 12/30/22 1050  SpO2 100 % 12/30/22 1050  Vitals shown include unfiled device data.  Last Pain:  Vitals:   12/30/22 0917  TempSrc: Temporal         Complications: No notable events documented.

## 2022-12-30 NOTE — Interval H&P Note (Signed)
History and Physical Interval Note:  12/30/2022 10:16 AM  Linda Walker  has presented today for surgery, with the diagnosis of RUQ pain,RLQ pain, dyspepsia,Melena.  The various methods of treatment have been discussed with the patient and family. After consideration of risks, benefits and other options for treatment, the patient has consented to  Procedure(s): COLONOSCOPY WITH PROPOFOL (N/A) ESOPHAGOGASTRODUODENOSCOPY (EGD) WITH PROPOFOL (N/A) as a surgical intervention.  The patient's history has been reviewed, patient examined, no change in status, stable for surgery.  I have reviewed the patient's chart and labs.  Questions were answered to the patient's satisfaction.     Regis Bill  Ok to proceed with EGD/Colonoscopy

## 2022-12-30 NOTE — Anesthesia Preprocedure Evaluation (Signed)
Anesthesia Evaluation  Patient identified by MRN, date of birth, ID band Patient awake    Reviewed: Allergy & Precautions, NPO status , Patient's Chart, lab work & pertinent test results  Airway Mallampati: III  TM Distance: >3 FB Neck ROM: full    Dental  (+) Teeth Intact   Pulmonary neg pulmonary ROS, COPD, Current SmokerPatient did not abstain from smoking.   Pulmonary exam normal  + decreased breath sounds      Cardiovascular Exercise Tolerance: Good negative cardio ROS Normal cardiovascular exam Rhythm:Regular Rate:Normal     Neuro/Psych Seizures -,  negative neurological ROS  negative psych ROS   GI/Hepatic negative GI ROS, Neg liver ROS,GERD  Medicated,,  Endo/Other  negative endocrine ROS    Renal/GU negative Renal ROS  negative genitourinary   Musculoskeletal negative musculoskeletal ROS (+)    Abdominal Normal abdominal exam  (+)   Peds negative pediatric ROS (+)  Hematology negative hematology ROS (+)   Anesthesia Other Findings Past Medical History: No date: Complication of anesthesia     Comment:  HARD TO WAKE UP No date: Diverticulitis No date: GERD (gastroesophageal reflux disease) No date: Headache No date: History of kidney stones No date: Low blood pressure     Comment:  90/60 NORMAL FOR PT No date: Pituitary mass (HCC)     Comment:  PT STATES THEY NEVER FIGURED OUT IF IT WAS A MASS OR A               CYST 2006: Seizure (HCC)     Comment:  RELATED TO PITUITARY MASS/CYST 2006: TIA (transient ischemic attack)     Comment:  [facility] WAS THINKING PT WAS HAVING TIA'S FROM POSSIBLE               PITUITARY MASS  Past Surgical History: 1997: ABDOMINAL HYSTERECTOMY 2005 ?: BLADDER SUSPENSION 08/29/2017: CHOLECYSTECTOMY; N/A     Comment:  Procedure: LAPAROSCOPIC CHOLECYSTECTOMY;  Surgeon:               Sung Amabile, DO;  Location: [facility] ORS;  Service: General;              Laterality: N/A; No  date: COLONOSCOPY No date: DIAGNOSTIC LAPAROSCOPY     Comment:  MULTIPLE No date: ESOPHAGOGASTRODUODENOSCOPY No date: KIDNEY SURGERY     Comment:  REFLUX No date: PARTIAL HYSTERECTOMY  BMI    Body Mass Index: 25.19 kg/m      Reproductive/Obstetrics negative OB ROS                             Anesthesia Physical Anesthesia Plan  ASA: 3  Anesthesia Plan: General   Post-op Pain Management:    Induction: Intravenous  PONV Risk Score and Plan: Propofol infusion and TIVA  Airway Management Planned: Natural Airway and Nasal Cannula  Additional Equipment:   Intra-op Plan:   Post-operative Plan:   Informed Consent: I have reviewed the patients History and Physical, chart, labs and discussed the procedure including the risks, benefits and alternatives for the proposed anesthesia with the patient or authorized representative who has indicated his/her understanding and acceptance.     Dental Advisory Given  Plan Discussed with: CRNA  Anesthesia Plan Comments:        Anesthesia Quick Evaluation

## 2023-01-02 ENCOUNTER — Encounter: Payer: Self-pay | Admitting: Gastroenterology

## 2023-01-02 LAB — SURGICAL PATHOLOGY

## 2023-03-10 ENCOUNTER — Ambulatory Visit
Admission: RE | Admit: 2023-03-10 | Discharge: 2023-03-10 | Disposition: A | Discharge status: Home / Self Care | Source: Ambulatory Visit | Attending: Internal Medicine | Admitting: Internal Medicine

## 2023-03-10 ENCOUNTER — Ambulatory Visit
Admission: RE | Admit: 2023-03-10 | Discharge: 2023-03-10 | Disposition: A | Discharge status: Home / Self Care | Payer: Self-pay | Source: Ambulatory Visit | Attending: Internal Medicine | Admitting: Internal Medicine

## 2023-03-10 DIAGNOSIS — N63 Unspecified lump in unspecified breast: Secondary | ICD-10-CM

## 2023-07-11 ENCOUNTER — Other Ambulatory Visit: Payer: Self-pay | Admitting: Internal Medicine

## 2023-07-11 DIAGNOSIS — I82402 Acute embolism and thrombosis of unspecified deep veins of left lower extremity: Secondary | ICD-10-CM

## 2023-07-12 ENCOUNTER — Ambulatory Visit
Admission: RE | Admit: 2023-07-12 | Discharge: 2023-07-12 | Disposition: A | Discharge status: Home / Self Care | Source: Ambulatory Visit | Attending: Internal Medicine | Admitting: Internal Medicine

## 2023-07-12 DIAGNOSIS — I82402 Acute embolism and thrombosis of unspecified deep veins of left lower extremity: Secondary | ICD-10-CM | POA: Diagnosis present

## 2023-08-10 ENCOUNTER — Encounter: Payer: Self-pay | Admitting: Internal Medicine
